# Patient Record
Sex: Male | Born: 2000 | Race: Black or African American | Hispanic: No | Marital: Single | Smoking: Never smoker
Health system: Southern US, Community
[De-identification: ages and names within clinical notes are randomized; demographics above are authoritative.]

## PROBLEM LIST (undated history)

## (undated) DIAGNOSIS — E119 Type 2 diabetes mellitus without complications: Secondary | ICD-10-CM

## (undated) HISTORY — DX: Type 2 diabetes mellitus without complications: E11.9

## (undated) HISTORY — PX: EAR TUBE REMOVAL: SHX1486

---
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
## 2021-01-07 ENCOUNTER — Encounter (INDEPENDENT_AMBULATORY_CARE_PROVIDER_SITE_OTHER): Payer: Self-pay

## 2021-03-13 ENCOUNTER — Telehealth (INDEPENDENT_AMBULATORY_CARE_PROVIDER_SITE_OTHER): Payer: Self-pay | Admitting: Family

## 2021-03-13 NOTE — Telephone Encounter (Signed)
  Who's calling (name and relationship to patient) : Kathie Rhodes (mother) and [name] Mcintosh (patient); Vishruth gave verbal permission to speak to his mother regarding this phone note.   Best contact number: [phone]  Provider they see: Gretchen Short, NP  Reason for call: Patient no longer has active Medicaid and will not be able to be covered on mom's insurance until January of 2023. They called inquiring about patient assistance programs for Dexcom, Lantus, Novolog, and test strips.  Please advise.      PRESCRIPTION REFILL ONLY  Name of prescription:  Pharmacy:

## 2021-03-13 NOTE — Telephone Encounter (Signed)
Please contact patient to ensure he has enough long acting and rapid acting insulin. If not please provide 2-3 samples of each.  There are patient assistance programs for insulin and Dexcom. You will have to complete an application and likely submit proof of income documentation (tax returns).  He can apply for [employer] patient assistance (will ship medications to his house) or NovoCares patient assistance (will ship medications to the office - patient will have to pick up medication from the office).  [employer] covers the following medications -Basaglar -Humalog  NovoCares covers the following medications -Tresiba -Novolog -Pen needles  Dexcom also has a patient assistance program and will ship medications to his house.  You can apply online to the [employer] and Dexcom patient assistance programs at the following links. You must print out forms then complete them then bring them to our office for the [facility] program.  Links for each assistance program are listed below:  [employer] Cares: [email or web address] NovoCares: https://www.novocare.com/content/dam/diabetes-patient/novocare/redesign/General/PAP-Application-EN.pdf  Dexcom: https://assistance.dexcom.com/PAP_SelfService/  If patient requires a manual glucometer, the most affordable is the Relion brand that is available over the counter at [employer]. He can buy the meter, test strips, lancing device, and lancets over the counter from [employer].   If he does not get needles from NovoCares then he can also purchase these over the counter at any pharmacy (he likely will need 32 gauge, 4 mm). The most affordable pharmacy to purchase pen needles at is [facility].   Thank you for involving clinical pharmacist/diabetes educator to assist in providing this patient's care.   Zachery Conch, PharmD, BCACP, CDCES, CPP

## 2021-03-13 NOTE — Telephone Encounter (Signed)
Made in error. Tariya Morrissette 

## 2021-03-14 ENCOUNTER — Other Ambulatory Visit (INDEPENDENT_AMBULATORY_CARE_PROVIDER_SITE_OTHER): Payer: Self-pay | Admitting: Family

## 2021-03-14 DIAGNOSIS — E109 Type 1 diabetes mellitus without complications: Secondary | ICD-10-CM

## 2021-03-17 ENCOUNTER — Encounter (INDEPENDENT_AMBULATORY_CARE_PROVIDER_SITE_OTHER): Payer: Self-pay | Admitting: Family

## 2021-03-17 ENCOUNTER — Ambulatory Visit (INDEPENDENT_AMBULATORY_CARE_PROVIDER_SITE_OTHER): Payer: Medicaid Other | Admitting: Family

## 2021-03-17 ENCOUNTER — Encounter (INDEPENDENT_AMBULATORY_CARE_PROVIDER_SITE_OTHER): Payer: Self-pay

## 2021-03-17 ENCOUNTER — Other Ambulatory Visit: Payer: Self-pay

## 2021-03-17 VITALS — BP 108/72 | HR 66 | Ht 70.47 in | Wt 209.8 lb

## 2021-03-17 DIAGNOSIS — E1065 Type 1 diabetes mellitus with hyperglycemia: Secondary | ICD-10-CM

## 2021-03-17 DIAGNOSIS — Z794 Long term (current) use of insulin: Secondary | ICD-10-CM

## 2021-03-17 LAB — POCT GLYCOSYLATED HEMOGLOBIN (HGB A1C): HbA1c, POC (controlled diabetic range): 7 % (ref 0.0–7.0)

## 2021-03-17 LAB — POCT GLUCOSE (DEVICE FOR HOME USE): Glucose Fasting, POC: 191 mg/dL — AB (ref 70–99)

## 2021-03-17 NOTE — Patient Instructions (Signed)
  [employer] Cares: [email or web address] NovoCares: https://www.novocare.com/content/dam/diabetes-patient/novocare/redesign/General/PAP-Application-EN.pdf  Dexcom: https://assistance.dexcom.com/PAP_SelfService/   It was a pleasure seeing you in clinic today. Please do not hesitate to contact me if you have questions or concerns.   Please sign up for MyChart. This is a communication tool that allows you to send an email directly to me. This can be used for questions, prescriptions and blood sugar reports. We will also release labs to you with instructions on MyChart. Please do not use MyChart if you need immediate or emergency assistance. Ask our wonderful front office staff if you need assistance.

## 2021-03-17 NOTE — Progress Notes (Signed)
Pediatric Endocrinology Diabetes Consultation Follow-up Visit  [name] Mcintosh Oct 19, 2000 [id number]  Chief Complaint: Follow-up Type 1 Diabetes    Pediatrics, [facility]   HPI: [name] Mcintosh  is a 20 y.o. male presenting for follow-up of Type 1 Diabetes   he is accompanied to this visit by his Mother  1. He was diagnosed with new onset type 1 diabetes on 06/2019. He was in the PICU initially then transitioned off insulin drip to MDI and received extensive diabetes education while at [facility]. His diabetes antibodies were negative but he had a low c-peptide.   2. Since his last visit to clinic on 06/2020 he has been well. No Er visits or hospitalizations.   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   After his last visit his blood sugars started running higher so he has increased his Lantus and Novolog. He estimates he is taking 6-8 units of Novolog per meal. Gives injections before eating. Does well carb counting and using his novolog plan. Wears Dexcom CGM most of the time.   [name] Mcintosh's medicaid was recently discontinued. Mother will not be able to add him to her plan until next year. They are concerned about being able to afford his insulin and CGM supplies.   Concerns:  - Insulin needs have increased  - insurance lost.    Insulin regimen: Lantus 10  units  Novolog 150/50/15  Hypoglycemia: can feel most low blood sugars.  No glucagon needed recently.  Blood glucose download:  CGM download:    Med-alert ID: is not currently wearing. Injection/Pump sites: abdomen  Annual labs due: 09/2021  Ophthalmology due: 2023.  Reminded to get annual dilated eye exam    3. ROS: Greater than 10 systems reviewed with pertinent positives listed in HPI, otherwise neg. Constitutional: Good energy. 19 lbs weight gain  Eyes: No changes in vision Ears/Nose/Mouth/Throat: No difficulty swallowing. Cardiovascular: No  palpitations Respiratory: No increased work of breathing Gastrointestinal: No constipation or diarrhea. No abdominal pain Genitourinary: No nocturia, no polyuria Musculoskeletal: No joint pain Neurologic: Normal sensation, no tremor Endocrine: No polydipsia.  No hyperpigmentation Psychiatric: Normal affect  Past Medical History:   Past Medical History:  Diagnosis Date   Diabetes mellitus without complication ([place])     Medications:  Outpatient Encounter Medications as of 03/17/2021  Medication Sig Note   Accu-Chek FastClix Lancets MISC Check sugar 10 x daily    Alcohol Swabs (ALCOHOL PADS) 70 % PADS Use to wipe skin prior to insulin injection 7 times daily    BD PEN NEEDLE NANO 2ND GEN 32G X 4 MM MISC BD PEN NEEDLES- BRAND SPECIFIC. INJECT INSULIN VIA INSULIN PEN 6 X DAILY    glucose blood (ACCU-CHEK GUIDE) test strip USE TO CHECK BLOOD GLUCOSE 6 TIMES DAILY    insulin aspart (NOVOLOG FLEXPEN) 100 UNIT/ML FlexPen 3 month supply. Inject up to 70 units per day as prescribed    insulin glargine (LANTUS SOLOSTAR) 100 UNIT/ML Solostar Pen 3 month supply. Inject up to 50 units per day    acetone, urine, test strip Check ketones per protocol (Patient not taking: No sig reported) 03/28/2020: PRN   Blood Glucose Monitoring Suppl (ACCU-CHEK GUIDE) w/Device KIT 1 kit by Does not apply route 6 (six) times daily. (Patient not taking: No sig reported)    Continuous Blood Gluc Sensor (DEXCOM G6 SENSOR) MISC 1 Units by Does not apply route as needed. 1 box  contain 3 sensors for one month. (Patient not taking: No sig reported)    Continuous Blood Gluc Transmit (DEXCOM G6 TRANSMITTER) MISC Change every 90 days. (Patient not taking: Reported on 03/17/2021)    Glucagon (BAQSIMI TWO PACK) 3 MG/DOSE POWD Place 1 application into the nose as needed. Use as directed if unconscious, unable to take food po, or having a seizure due to hypoglycemia (Patient not taking: No sig reported) 03/28/2020: PRN emergencies    Lancets Misc. (ACCU-CHEK FASTCLIX LANCET) KIT Use to check blood sugar up to 10 times daily (Patient not taking: No sig reported)    No facility-administered encounter medications on file as of 03/17/2021.    Allergies: No Known Allergies  Surgical History: Past Surgical History:  Procedure Laterality Date   EAR TUBE REMOVAL      Family History:  No family history on file.    Social History: Lives with: Mother Freshman year at [employer].   Physical Exam:  Vitals:   03/17/21 0943  BP: 108/72  Pulse: 66  Weight: 209 lb 12.8 oz (95.2 kg)  Height: 5' 10.47" (1.79 m)    BP 108/72   Pulse 66   Ht 5' 10.47" (1.79 m)   Wt 209 lb 12.8 oz (95.2 kg)   BMI 29.70 kg/m  Body mass index: body mass index is 29.7 kg/m. Blood pressure percentiles are not available for patients who are 18 years or older.  Ht Readings from Last 3 Encounters:  03/17/21 5' 10.47" (1.79 m) (64 %, Z= 0.35)*  07/02/20 5' 10.47" (1.79 m) (66 %, Z= 0.40)*  06/28/20 5' 10.47" (1.79 m) (66 %, Z= 0.40)*   * Growth percentiles are based on CDC (Boys, 2-20 Years) data.   Wt Readings from Last 3 Encounters:  03/17/21 209 lb 12.8 oz (95.2 kg) (96 %, Z= 1.73)*  07/02/20 190 lb 3.2 oz (86.3 kg) (91 %, Z= 1.36)*  06/28/20 190 lb 9.6 oz (86.5 kg) (92 %, Z= 1.37)*   * Growth percentiles are based on CDC (Boys, 2-20 Years) data.   General: Well developed, well nourished male in no acute distress.   Head: Normocephalic, atraumatic.   Eyes:  Pupils equal and round. EOMI.  Sclera white.  No eye drainage.   Ears/Nose/Mouth/Throat: Nares patent, no nasal drainage.  Normal dentition, mucous membranes moist.  Neck: supple, no cervical lymphadenopathy, no thyromegaly Cardiovascular: regular rate, normal S1/S2, no murmurs Respiratory: No increased work of breathing.  Lungs clear to auscultation bilaterally.  No wheezes. Abdomen: soft, nontender, nondistended. Normal bowel sounds.  No appreciable masses  Extremities: warm,  well perfused, cap refill < 2 sec.   Musculoskeletal: Normal muscle mass.  Normal strength Skin: warm, dry.  No rash or lesions. Neurologic: alert and oriented, normal speech, no tremor   Labs: Last hemoglobin A1c: 7.4% on 09/2020 Lab Results  Component Value Date   HGBA1C 7.0 03/17/2021     Lab Results  Component Value Date   HGBA1C 7.0 03/17/2021   HGBA1C 7.4 (H) 09/10/2020   HGBA1C 7.1 (A) 06/28/2020    Lab Results  Component Value Date   CREATININE 0.90 09/10/2020    Assessment/Plan: Alcee is a 20 y.o. male with recently diagnosed type 1 diabetes on MDI. He is out of honeymoon period and has increased insulin. Hemoglobin A1c is 7% today which meets ADA goal.     1. Type 1 diabetes  2. Hyperglycemia  3. Hypoglycemia  4. Insulin dose change  - Reviewed meter  and  CGM download. Discussed trends and patterns.  - Rotate injection sites to prevent scar tissue.  - bolus 15 minutes prior to eating to limit blood sugar spikes.  - Reviewed carb counting and importance of accurate carb counting.  - Discussed signs and symptoms of hypoglycemia. Always have glucose available.  - POCT glucose and hemoglobin A1c  - Reviewed growth chart.  - 10 unis of Lantus  - Novolog 150/50/15  plan  - Gave resources for [employer] and Dexcom patient assistance programs. Also advised to contact [place] about student insurance.  - Microalbumin ordered  Follow-up:   3 months.   Medical decision-making:  >45 spent today reviewing the medical chart, counseling the patient/family, and documenting today's visit.    When a patient is on insulin, intensive monitoring of blood glucose levels is necessary to avoid hyperglycemia and hypoglycemia. Severe hyperglycemia/hypoglycemia can lead to hospital admissions and be life threatening.     Hermenia Bers,  FNP-C  Pediatric Specialist  [address] [place]  [place], [address]  Tele: [phone]

## 2021-03-26 ENCOUNTER — Other Ambulatory Visit (INDEPENDENT_AMBULATORY_CARE_PROVIDER_SITE_OTHER): Payer: Self-pay | Admitting: Family

## 2021-03-31 ENCOUNTER — Other Ambulatory Visit (INDEPENDENT_AMBULATORY_CARE_PROVIDER_SITE_OTHER): Payer: Self-pay | Admitting: Family

## 2021-03-31 ENCOUNTER — Encounter (INDEPENDENT_AMBULATORY_CARE_PROVIDER_SITE_OTHER): Payer: Self-pay

## 2021-04-02 ENCOUNTER — Other Ambulatory Visit (INDEPENDENT_AMBULATORY_CARE_PROVIDER_SITE_OTHER): Payer: Self-pay | Admitting: Family

## 2021-04-02 ENCOUNTER — Other Ambulatory Visit (INDEPENDENT_AMBULATORY_CARE_PROVIDER_SITE_OTHER): Payer: Self-pay

## 2021-04-02 DIAGNOSIS — E109 Type 1 diabetes mellitus without complications: Secondary | ICD-10-CM

## 2021-04-02 MED ORDER — BD PEN NEEDLE NANO 2ND GEN 32G X 4 MM MISC: 5 refills | Status: DC

## 2021-04-02 MED ORDER — DEXCOM G6 SENSOR MISC: 11 refills | Status: DC

## 2021-04-08 ENCOUNTER — Encounter (INDEPENDENT_AMBULATORY_CARE_PROVIDER_SITE_OTHER): Payer: Self-pay

## 2021-04-08 ENCOUNTER — Other Ambulatory Visit (INDEPENDENT_AMBULATORY_CARE_PROVIDER_SITE_OTHER): Payer: Self-pay

## 2021-04-08 DIAGNOSIS — E109 Type 1 diabetes mellitus without complications: Secondary | ICD-10-CM

## 2021-04-08 MED ORDER — BD PEN NEEDLE NANO 2ND GEN 32G X 4 MM MISC
5 refills | Status: DC
Start: 2021-04-08 — End: 2021-04-23

## 2021-04-23 ENCOUNTER — Other Ambulatory Visit (INDEPENDENT_AMBULATORY_CARE_PROVIDER_SITE_OTHER): Payer: Self-pay | Admitting: Pediatric Endocrinology

## 2021-04-23 DIAGNOSIS — E109 Type 1 diabetes mellitus without complications: Secondary | ICD-10-CM

## 2021-04-29 ENCOUNTER — Encounter (INDEPENDENT_AMBULATORY_CARE_PROVIDER_SITE_OTHER): Payer: Self-pay

## 2021-04-29 ENCOUNTER — Other Ambulatory Visit (INDEPENDENT_AMBULATORY_CARE_PROVIDER_SITE_OTHER): Payer: Self-pay

## 2021-04-29 DIAGNOSIS — E109 Type 1 diabetes mellitus without complications: Secondary | ICD-10-CM

## 2021-04-29 MED ORDER — NOVOLOG FLEXPEN 100 UNIT/ML ~~LOC~~ SOPN
PEN_INJECTOR | SUBCUTANEOUS | 2 refills | Status: DC
Start: 2021-04-29 — End: 2021-08-08

## 2021-04-30 ENCOUNTER — Telehealth (INDEPENDENT_AMBULATORY_CARE_PROVIDER_SITE_OTHER): Payer: Self-pay

## 2021-04-30 ENCOUNTER — Other Ambulatory Visit (INDEPENDENT_AMBULATORY_CARE_PROVIDER_SITE_OTHER): Payer: Self-pay

## 2021-04-30 ENCOUNTER — Encounter (INDEPENDENT_AMBULATORY_CARE_PROVIDER_SITE_OTHER): Payer: Self-pay

## 2021-04-30 NOTE — Telephone Encounter (Signed)
Debe Coder Key: [id number] - PA Case ID: [id number] - Rx #: [id number] Need help? Call us at [phone] Status Sent to [employer] Dexcom G6 Sensor Form [employer] Medicaid Electronic Prior Authorization Form [phone] NCPDP) Original Claim Info 81

## 2021-04-30 NOTE — Telephone Encounter (Signed)
Sensors approved. Notified patient via my chart

## 2021-06-11 ENCOUNTER — Encounter (INDEPENDENT_AMBULATORY_CARE_PROVIDER_SITE_OTHER): Payer: Self-pay

## 2021-06-16 ENCOUNTER — Ambulatory Visit (INDEPENDENT_AMBULATORY_CARE_PROVIDER_SITE_OTHER): Payer: Self-pay | Admitting: Family

## 2021-07-07 ENCOUNTER — Ambulatory Visit (INDEPENDENT_AMBULATORY_CARE_PROVIDER_SITE_OTHER): Payer: Medicaid Other | Admitting: Family

## 2021-07-07 ENCOUNTER — Other Ambulatory Visit: Payer: Self-pay

## 2021-07-07 ENCOUNTER — Encounter (INDEPENDENT_AMBULATORY_CARE_PROVIDER_SITE_OTHER): Payer: Self-pay | Admitting: Family

## 2021-07-07 VITALS — BP 120/78 | HR 82 | Wt 214.6 lb

## 2021-07-07 DIAGNOSIS — E10649 Type 1 diabetes mellitus with hypoglycemia without coma: Secondary | ICD-10-CM | POA: Diagnosis not present

## 2021-07-07 DIAGNOSIS — Z794 Long term (current) use of insulin: Secondary | ICD-10-CM | POA: Diagnosis not present

## 2021-07-07 DIAGNOSIS — E1065 Type 1 diabetes mellitus with hyperglycemia: Secondary | ICD-10-CM | POA: Diagnosis not present

## 2021-07-07 LAB — POCT GLUCOSE (DEVICE FOR HOME USE): POC Glucose: 257 mg/dl — AB (ref 70–99)

## 2021-07-07 LAB — POCT GLYCOSYLATED HEMOGLOBIN (HGB A1C): Hemoglobin A1C: 7.2 % — AB (ref 4.0–5.6)

## 2021-07-07 MED ORDER — BAQSIMI TWO PACK 3 MG/DOSE NA POWD: 1.0000 "application " | NASAL | 1 refills | Status: DC | PRN

## 2021-07-07 NOTE — Patient Instructions (Addendum)
-   increase Lantus to 13 units  - At night, give 50% of you recommended correction insulin.  - You get 1 unit for every 50 points above 150  - Give 1 unit for every 12 grams of carbs.   - Look at Omnipod vs Tandem TSlim Control IQ   You are being referred for insulin pump training.     The first class you must attend is the prepump appointment. This is a one-on-one class with the patient, patient's caregivers, and diabetes educator. This class may take up to 1 hour and is required to be successful with insulin pump management. This class can be virtual or in-person.   We will complete required documentation for starting insulin pump. If this appointment is virtual, you must have access to a computer to complete forms online.   Topics discussed will include the following list: Insulin Pump Basics (bolus, basal, insulin on board) Pump Site Failure Pump Failure Traveling Tips Instructions for Pump Appointment  Please come prepared to learn and take notes as we take the next step in your diabetes journey!  After completion of prepump class, you will be scheduled for a pump start class that must be attended in-person. This class is a one-on-one class with the patient, patient's caregivers, and diabetes educator. This class may take up to 2 hours.   Topics discussed will include the following list:  Synching pump and electronic devices to office General information about your insulin pump  How to use features of your insulin pump How to appropriately do a site change How to bolus via your insulin pump Pump alarms/alerts Temporary basal rates Account creation for pump devices  After completion of pump class, you will be scheduled for a pump follow up appointment. This pump follow up appointment may take up to 1 hour. This class may be in-person or virtual (if pump has been setup to share with the clinic).  Topics discussed will include the following list: Insulin pump settings changes  (if necessary) General review of any issues since pump start Extended bolus Exercise/physical activity management  If you have any questions/concerns regarding this process please contact [phone].

## 2021-07-07 NOTE — Progress Notes (Signed)
Pediatric Endocrinology Diabetes Consultation Follow-up Visit  Mohamedamin ESTLE HUGULEY [birth year] [id number]  Chief Complaint: Follow-up Type 1 Diabetes    Pediatrics, [facility]   HPI: [name] Mcintosh  is a 20 y.o. male presenting for follow-up of Type 1 Diabetes   he is accompanied to this visit by his Mother  1. He was diagnosed with new onset type 1 diabetes on 06/2019. He was in the PICU initially then transitioned off insulin drip to MDI and received extensive diabetes education while at [facility]. His diabetes antibodies were negative but he had a low c-peptide.   2. Since his last visit to clinic on 03/2021 he has been well. No Er visits or hospitalizations.   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 He is living on campus which is going well. He is exercising almost every day, working out at gym.   Since his last visit, he got [place] Medicaid reinstated. Using Dexcom CGM, which is working well for him. He is taking most of his shots before eating. He estimates he gives between 10-12 units at meals. Hypoglycemia has occurred more frequently recently. He reports that he has been estimating his insulin doses instead of calculating them using his plan. He states that he tends to go low at night because he will "guess" the correction dose and gives to much. When he does not give corrections overnight, his blood sugars tend to stay higher.    Insulin regimen: Lantus 12  units  Novolog 150/50/15  Hypoglycemia: can feel most low blood sugars.  No glucagon needed recently.  Blood glucose download:  CGM download:    - Pattern of blood sugars dropping overnight in response to correction insulin.  Med-alert ID: is not currently wearing. Injection/Pump sites: abdomen  Annual labs due: 09/2021  Ophthalmology due: 2023.  Reminded to get annual dilated eye exam    3. ROS: Greater than 10 systems reviewed with pertinent positives listed in HPI, otherwise neg. Constitutional: Good energy.  Weight stable  Eyes: No changes in vision Ears/Nose/Mouth/Throat: No difficulty swallowing. Cardiovascular: No palpitations Respiratory: No increased work of breathing Gastrointestinal: No constipation or diarrhea. No abdominal pain Genitourinary: No nocturia, no polyuria Musculoskeletal: No joint pain Neurologic: Normal sensation, no tremor Endocrine: No polydipsia.  No hyperpigmentation Psychiatric: Normal affect  Past Medical History:   Past Medical History:  Diagnosis Date   Diabetes mellitus without complication ([place])     Medications:  Outpatient Encounter Medications as of 07/07/2021  Medication Sig Note   Continuous Blood Gluc Sensor (DEXCOM G6 SENSOR) MISC Change every 10 days    Continuous Blood Gluc Transmit (DEXCOM G6 TRANSMITTER) MISC USE AS DIRECTED    insulin aspart (NOVOLOG FLEXPEN) 100 UNIT/ML FlexPen 3 month supply. Inject up to 70 units per day as prescribed    insulin glargine (LANTUS SOLOSTAR) 100 UNIT/ML Solostar Pen 3 month supply. Inject up to 50 units per day    Accu-Chek FastClix Lancets MISC Check sugar 10 x daily (Patient not taking: Reported on 07/07/2021)    acetone, urine, test strip Check ketones per protocol (Patient not taking: Reported on 03/28/2020) 03/28/2020: PRN   Alcohol Swabs (ALCOHOL PADS) 70 % PADS Use to wipe skin prior to insulin injection 7 times daily (Patient not taking: Reported on 07/07/2021)    Blood Glucose Monitoring Suppl (ACCU-CHEK GUIDE) w/Device KIT 1 kit by Does not apply route 6 (six) times daily. (Patient not taking: Reported on 06/28/2020)    Glucagon (BAQSIMI TWO PACK) 3 MG/DOSE  POWD Place 1 application into the nose as needed. Use as directed if unconscious, unable to take food po, or having a seizure due to hypoglycemia    glucose blood (ACCU-CHEK GUIDE) test strip USE TO CHECK BLOOD GLUCOSE 6 TIMES DAILY (Patient not taking: Reported on 07/07/2021)    Insulin Pen Needle (BD PEN NEEDLE NANO 2ND GEN) 32G X 4 MM MISC Use to  inject insulin 8 times a day (Patient not taking: Reported on 07/07/2021)    Lancets Misc. (ACCU-CHEK FASTCLIX LANCET) KIT Use to check blood sugar up to 10 times daily (Patient not taking: Reported on 06/28/2020)    [DISCONTINUED] Glucagon (BAQSIMI TWO PACK) 3 MG/DOSE POWD Place 1 application into the nose as needed. Use as directed if unconscious, unable to take food po, or having a seizure due to hypoglycemia (Patient not taking: Reported on 03/28/2020) 03/28/2020: PRN emergencies   No facility-administered encounter medications on file as of 07/07/2021.    Allergies: No Known Allergies  Surgical History: Past Surgical History:  Procedure Laterality Date   EAR TUBE REMOVAL      Family History:  No family history on file.    Social History: Lives with: Mother Freshman year at [employer]   Physical Exam:  Vitals:   07/07/21 1125  BP: 120/78  Pulse: 82  Weight: 214 lb 9.6 oz (97.3 kg)     BP 120/78 (BP Location: Right Arm, Patient Position: Sitting, Cuff Size: Normal)   Pulse 82   Wt 214 lb 9.6 oz (97.3 kg)   BMI 30.38 kg/m  Body mass index: body mass index is 30.38 kg/m. Blood pressure percentiles are not available for patients who are 18 years or older.  Ht Readings from Last 3 Encounters:  03/17/21 5' 10.47" (1.79 m) (64 %, Z= 0.35)*  07/02/20 5' 10.47" (1.79 m) (66 %, Z= 0.40)*  06/28/20 5' 10.47" (1.79 m) (66 %, Z= 0.40)*   * Growth percentiles are based on CDC (Boys, 2-20 Years) data.   Wt Readings from Last 3 Encounters:  07/07/21 214 lb 9.6 oz (97.3 kg) (96 %, Z= 1.80)*  03/17/21 209 lb 12.8 oz (95.2 kg) (96 %, Z= 1.73)*  07/02/20 190 lb 3.2 oz (86.3 kg) (91 %, Z= 1.36)*   * Growth percentiles are based on CDC (Boys, 2-20 Years) data.   General: Well developed, well nourished male in no acute distress.   Head: Normocephalic, atraumatic.   Eyes:  Pupils equal and round. EOMI.  Sclera white.  No eye drainage.   Ears/Nose/Mouth/Throat: Nares patent, no  nasal drainage.  Normal dentition, mucous membranes moist.  Neck: supple, no cervical lymphadenopathy, no thyromegaly Cardiovascular: regular rate, normal S1/S2, no murmurs Respiratory: No increased work of breathing.  Lungs clear to auscultation bilaterally.  No wheezes. Abdomen: soft, nontender, nondistended. Normal bowel sounds.  No appreciable masses  Extremities: warm, well perfused, cap refill < 2 sec.   Musculoskeletal: Normal muscle mass.  Normal strength Skin: warm, dry.  No rash or lesions. Neurologic: alert and oriented, normal speech, no tremor   Labs: Last hemoglobin A1c: 7.0% on 03/2021 Lab Results  Component Value Date   HGBA1C 7.2 (A) 07/07/2021     Lab Results  Component Value Date   HGBA1C 7.2 (A) 07/07/2021   HGBA1C 7.0 03/17/2021   HGBA1C 7.4 (H) 09/10/2020    Lab Results  Component Value Date   CREATININE 0.90 09/10/2020    Assessment/Plan: Ankur is a 20 y.o. male with recently diagnosed type 1  diabetes on MDI. He has experienced overnight hypoglycemia due to guessing at insulin dose and giving to strong of dose. He also has a pattern of blood sugars rising overnight when he does not give correction. He would benefit from closed loop insulin pump therapy. Hemoglobin A1c is 7.2% today.     1. Type 1 diabetes  2. Hyperglycemia  3. Hypoglycemia  - Reviewed meter and CGM download. Discussed trends and patterns.  - Rotate injection sites to prevent scar tissue.  - bolus 15 minutes prior to eating to limit blood sugar spikes.  - Reviewed carb counting and importance of accurate carb counting.  - Discussed signs and symptoms of hypoglycemia. Always have glucose available.  - POCT glucose and hemoglobin A1c  - Reviewed growth chart.  - Discussed hypoglycemia extensively including risk for death and seizure. Discussed treatment including baqsimi if he is unconscious.  - Discussed insulin pump therapy and pump options. Will schedule pump intro class with  Dr. Lovena Le.   4. Insulin Dose change  - Increase lantus to 13 units  - Start Novolog 150/50/12 plan. At night reduce correction dose by 50%.    Follow-up:   3 months.   Medical decision-making:  >45 spent today reviewing the medical chart, counseling the patient/family, and documenting today's visit.   When a patient is on insulin, intensive monitoring of blood glucose levels is necessary to avoid hyperglycemia and hypoglycemia. Severe hyperglycemia/hypoglycemia can lead to hospital admissions and be life threatening.     Hermenia Bers,  FNP-C  Pediatric Specialist  [address] [place]  [place], [address]  Tele: [phone]

## 2021-07-18 ENCOUNTER — Other Ambulatory Visit (INDEPENDENT_AMBULATORY_CARE_PROVIDER_SITE_OTHER): Payer: Self-pay | Admitting: Family

## 2021-07-18 DIAGNOSIS — E109 Type 1 diabetes mellitus without complications: Secondary | ICD-10-CM

## 2021-07-26 NOTE — Progress Notes (Addendum)
This is a Pediatric Specialist E-Visit (My Chart Video Visit) follow up consult provided via WebEx [name] Mcintosh consented to an E-Visit consult today.  Location of patient: [name] Mcintosh is at home  Location of provider: Zachery Conch, PharmD, BCACP, CDCES, CPP is at office.    S:     Chief Complaint  Patient presents with   Diabetes    Prepump Appointment     Endocrinology provider: Dr. Larinda Buttery (upcoming appt 10/07/21 11:00 am)  Patient has decided to initiate process to start Omnipod 5 insulin pump. PMH significant for T1DM.   I connected with [name] Mcintosh on 08/01/21 by video and verified that I am speaking with the correct person using two identifiers. He is taking Novolog 12 units with each meals (2 meals/day). He eats a few no carb snacks/day. He forgot to give his Lantus yesterday. He is taking 13 units of Lantus.  Insurance Coverage: [place] Managed Medicaid ([employer])  Preferred Pharmacy [id number] [place], [place] - [place] [address]  [address], [place] [address]  Phone:  [phone]  Fax:  [phone]  DEA #:  [id number]  DAW Reason: --    Medication Adherence -Patient reports adherence with medications.  -Current diabetes medications include: Lantus 13 units daily, Novolog (ICR 1:15, ISF 1:50, target BG 150; 50% reduction in correction dose at night) -Prior diabetes medications include: denies   Pre-pump Topics Insulin Pump Basics Sick Day Management Pump Failure Travel  Pump Start Instructions   Labs:    There were no vitals filed for this visit.  HbA1c Lab Results  Component Value Date   HGBA1C 7.2 (A) 07/07/2021   HGBA1C 7.0 03/17/2021   HGBA1C 7.4 (H) 09/10/2020    Pancreatic Islet Cell Autoantibodies Lab Results  Component Value Date   ISLETAB Negative 07/01/2019    Insulin Autoantibodies Lab Results  Component Value Date   INSULINAB <5.0 07/01/2019    Glutamic Acid Decarboxylase Autoantibodies Lab Results  Component  Value Date   GLUTAMICACAB <5.0 07/01/2019    ZnT8 Autoantibodies No results found for: ZNT8AB  IA-2 Autoantibodies No results found for: LABIA2  C-Peptide Lab Results  Component Value Date   CPEPTIDE 0.44 (L) 09/10/2020    Microalbumin No results found for: MICRALBCREAT  Lipids No results found for: CHOL, TRIG, HDL, CHOLHDL, VLDL, LDLCALC, LDLDIRECT  Assessment: Education - Thoroughly discussed all pre-pump topics (insulin pump basics, sick day management, pump failure, travel, and pump start instructions).   Pump Start Instructions - Sent prescription for Novolog vial to patient's preferred pharmacy. The patient/family understand that the family should bring all insulin pump supplies as well as insulin vial to pump start appointment. Advised patient to [facility] long acting insulin on the day before appointment.   Plan: Pre-Pump Education Discussed all pre-pump topics (insulin pump basics, sick day management, pump failure, travel, and pump start instructions) until family felt confident in their understanding of each topic.  Pump Start Appointment Sent prescription for Novolog vial to patient's preferred pharmacy.  The patient/family understand that the family should bring all insulin pump supplies as well as insulin vial to pump start appointment.  Advised patient to [facility] long acting insulin on day before appointment.  Follow Up: 08/08/21  Emailed patient instructions to [name].    This appointment required 45 minutes of patient care (this includes precharting, chart review, review of results, face-to-face care, etc.).  Thank you for involving clinical pharmacist/diabetes educator to assist in providing this patient's  care.  Zachery Conch, PharmD, BCACP, CDCES, CPP  I have reviewed the following documentation and am in agreeance with the plan. I was immediately available to the clinical pharmacist for questions and collaboration. Gretchen Short,  FNP-C   Pediatric Specialist  [address]  [place], [address]  Tele: [phone]

## 2021-07-31 ENCOUNTER — Encounter (INDEPENDENT_AMBULATORY_CARE_PROVIDER_SITE_OTHER): Payer: Self-pay | Admitting: Pharmacist

## 2021-08-01 ENCOUNTER — Telehealth (INDEPENDENT_AMBULATORY_CARE_PROVIDER_SITE_OTHER): Payer: Medicaid Other | Admitting: Pharmacist

## 2021-08-01 ENCOUNTER — Telehealth (INDEPENDENT_AMBULATORY_CARE_PROVIDER_SITE_OTHER): Payer: Self-pay

## 2021-08-01 DIAGNOSIS — E1065 Type 1 diabetes mellitus with hyperglycemia: Secondary | ICD-10-CM

## 2021-08-01 MED ORDER — OMNIPOD 5 DEXG7G6 INTRO GEN 5 KIT: 1.0000 | PACK | 2 refills | Status: DC

## 2021-08-01 MED ORDER — INSULIN ASPART 100 UNIT/ML IJ SOLN: INTRAMUSCULAR | 5 refills | Status: DC

## 2021-08-01 NOTE — Telephone Encounter (Signed)
Called patient per Dr. Ladona Ridgel to let him know she is running behind, no answer, left HIPAA approved voicemail and my chart message sent.

## 2021-08-03 NOTE — Progress Notes (Addendum)
Subjective:  Chief Complaint  Patient presents with   Omnipod 5 pump start    Endocrinology provider: Gretchen Short, NP (upcoming appt 10/07/21 11:00 am)  Patient referred to me by Gretchen Short, NP for Omnipod 5 pump training. PMH significant for T1DM. Patient is currently using Dexcom G6 CGM. Patient reports taking Lantus 13 units daily and Novolog (ICR 1:15, ISF 1:50, target BG 150). Basal injection was last admnistered 08/06/21.   Patient presents today independently. He has been administering Novolog ~12 units twice daily. He states when he gives insulin to lower his BG readings in the AM (correction dose) it does not successfully lower BG reading. He states when he gives insulin for meals it will work and lower BG reading appropriately.  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 He exercises by going to crossfit.  Insurance: [place] Managed Medicaid ([employer])  Pharmacy (when home from college) [id number] [place], [place] - [address]  [address], [place] [address]  Phone:  [phone]  Fax:  [phone]  DEA #:  [id number]  DAW Reason: --   Pharmacy (when at college) [id number] [place], [place] - [address]  [address] [place], [place] [address]  Phone:  [phone]  Fax:  [phone]  DEA #:  [id number]  DAW Reason: --    Omnipod 5 Pump Serial Number: [id number]  Omnipod Education Training Please refer to Omnipod 5 Pod Start Checklist scanned into media  G[place]o Account:  -User: [email or web address] -Pass: [id number]!  Podder Account:  -User: [email or web address] -Pass: [id number]!  Objective:  Dexcom Clarity Report   There were no vitals filed for this visit.  HbA1c Lab Results  Component Value Date   HGBA1C 7.2 (A) 07/07/2021   HGBA1C 7.0 03/17/2021   HGBA1C 7.4 (H) 09/10/2020    Pancreatic Islet Cell Autoantibodies Lab Results  Component Value Date    ISLETAB Negative 07/01/2019    Insulin Autoantibodies Lab Results  Component Value Date   INSULINAB <5.0 07/01/2019    Glutamic Acid Decarboxylase Autoantibodies Lab Results  Component Value Date   GLUTAMICACAB <5.0 07/01/2019    ZnT8 Autoantibodies No results found for: ZNT8AB  IA-2 Autoantibodies No results found for: LABIA2  C-Peptide Lab Results  Component Value Date   CPEPTIDE 0.44 (L) 09/10/2020    Microalbumin No results found for: MICRALBCREAT  Lipids No results found for: CHOL, TRIG, HDL, CHOLHDL, VLDL, LDLCALC, LDLDIRECT  Assessment: Pump Settings - Reviewed Dexcom Clarity report. He is taking TDD 37 units/day. This is ~0.38 units/kg/day. This is extremely low dose considering his age/weight/pubertal status. He is experiencing hyperglycemia throughout entire day so will increase Lantus 13 units --> 14 units; will divide by 24 to determine basal dose (0.60 units/hr). Based on rule of 450 ICR of 12 may be appropriate and based on rule of 1800 ISF of 50 is appropriate. He does complain that if he administers a correction dose upon waking (~10am) that it will not lower his BG reading. Will decrease ISF 50 --> 45 at that time and continue ISF of 50 throughout remainder of day. He states he feels his insulin works well when he boluses for meals. Will continue ICR 15 (he eats breakfast/lunch ~1pm and dinner ~7pm). Change target BG to 110 considering upgrade to hybrid closed loop system.  Pump Education - Omnipod pump applied successfully to right side of abdomen (within line of sight of Dexcom on right  leg). Parents appeared to have sufficient understanding of subjects discussed during Omnipod Training appt. Extensively reviewed how to bolus before vs after eating (if he forgets to before) and activity mode vs temp basal rate when going to cross fit.  Plan: Pump Settings  Basal (Max: 1.1 units/hr) 12AM 0.60                     Total: 14.4 units  Insulin to  carbohydrate ratio (ICR)  12AM 15  1PM 15  7PM 15               Max Bolus: 16 units  Insulin Sensitivity Factor (ISF) 12AM 50  10AM 45  1PM 50                 Target BG 12AM 110                        Omnipod Pump Education:  Continue to wear Omnipod and change pod every 3 days (pod filled 150 units) Thoroughly discussed how to assess bad infusion site change and appropriate management (notice BG is elevated, attempt to bolus via pump, recheck BG in 30 minutes, if BG has not decreased then disconnect pump and administer bolus via insulin pen, apply new infusion set, and repeat process).  Discussed back up plan if pump breaks (how to calculate insulin doses using insulin pens). Provided written copy of patient's current pump settings and handout explaining math on how to calculate settings. Discussed examples with family. Patient was able to use teach back method to demonstrate understanding of calculating dose for basal/bolus insulin pens from insulin pump settings.  Patient has Lantus and Novolog insulin pen refills to use as back up until 07/2022. Reminded family they will need a new prescription annually.  Reimbursement Uploaded Omnipod 5 Pod Start Checklist and Omnipod Dash Pump Therapy Order Form to [facility] Follow Up:  Within 1 month  Emailed Omnipod 5 Resource guide to [email or web address]  This appointment required 120 minutes of patient care (this includes precharting, chart review, review of results, face-to-face care, etc.).  Thank you for involving clinical pharmacist/diabetes educator to assist in providing this patient's care.  [employer], PharmD, BCACP, CDCES, CPP  I have reviewed the following documentation and am in agreeance with the plan. I was immediately available to the clinical pharmacist for questions and collaboration.  Zachery Conch,  FNP-C  Pediatric Specialist  [address]  [place], [place]  Tele: [phone]

## 2021-08-06 ENCOUNTER — Encounter (INDEPENDENT_AMBULATORY_CARE_PROVIDER_SITE_OTHER): Payer: Self-pay | Admitting: Pharmacist

## 2021-08-07 ENCOUNTER — Other Ambulatory Visit (INDEPENDENT_AMBULATORY_CARE_PROVIDER_SITE_OTHER): Payer: Self-pay | Admitting: Pharmacist

## 2021-08-07 DIAGNOSIS — E1065 Type 1 diabetes mellitus with hyperglycemia: Secondary | ICD-10-CM

## 2021-08-07 MED ORDER — OMNIPOD 5 DEXG7G6 INTRO GEN 5 KIT: 1.0000 | PACK | 2 refills | Status: DC

## 2021-08-08 ENCOUNTER — Ambulatory Visit (INDEPENDENT_AMBULATORY_CARE_PROVIDER_SITE_OTHER): Payer: Medicaid Other | Admitting: Pharmacist

## 2021-08-08 ENCOUNTER — Other Ambulatory Visit: Payer: Self-pay

## 2021-08-08 ENCOUNTER — Encounter (INDEPENDENT_AMBULATORY_CARE_PROVIDER_SITE_OTHER): Payer: Self-pay | Admitting: Pharmacist

## 2021-08-08 VITALS — Ht 70.39 in | Wt 215.6 lb

## 2021-08-08 DIAGNOSIS — E109 Type 1 diabetes mellitus without complications: Secondary | ICD-10-CM

## 2021-08-08 DIAGNOSIS — E1065 Type 1 diabetes mellitus with hyperglycemia: Secondary | ICD-10-CM

## 2021-08-08 DIAGNOSIS — E10649 Type 1 diabetes mellitus with hypoglycemia without coma: Secondary | ICD-10-CM

## 2021-08-08 LAB — POCT GLUCOSE (DEVICE FOR HOME USE): Glucose Fasting, POC: 227 mg/dL — AB (ref 70–99)

## 2021-08-08 MED ORDER — NOVOLOG FLEXPEN 100 UNIT/ML ~~LOC~~ SOPN: PEN_INJECTOR | SUBCUTANEOUS | 2 refills | Status: DC

## 2021-08-08 MED ORDER — INSULIN ASPART 100 UNIT/ML IJ SOLN: INTRAMUSCULAR | 5 refills | Status: DC

## 2021-08-08 MED ORDER — LANTUS SOLOSTAR 100 UNIT/ML ~~LOC~~ SOPN: PEN_INJECTOR | SUBCUTANEOUS | 2 refills | Status: DC

## 2021-08-08 NOTE — Patient Instructions (Signed)
It was a pleasure seeing you today!  If your pump breaks, your long acting insulin dose would be Lantus/Basaglar/Semglee 14 units daily. You would do the following equation for your Novolog/Humalog:  Novolog/Humalog total dose = food dose + correction dose Food dose: total carbohydrates divided by insulin carbohydrate ratio (ICR) Your ICR is 15 for breakfast, 15 for lunch, and 15 for dinner Correction dose: (current blood sugar - target blood sugar) divided by insulin sensitivity factor (ISF) Your ISF is 45 between 10am-1pm, 50 between 1pm-12am and 12am-1pm. Your target blood sugar is 120 during the day and 180 at night.  PLEASE REMEMBER TO CONTACT OFFICE IF YOU ARE AT RISK OF RUNNING OUT OF PUMP SUPPLIES, INSULIN PEN SUPPLIES, OR IF YOU WANT TO KNOW WHAT YOUR BACK UP INSULIN PEN DOSES ARE.   To summarize our visit, these are the major updates with Omnipod 5:  Automated vs limited vs manual mode Automated mode: this is when the smart pump is turned on and pump will adjust insulin based on Dexcom readings predicted 60 minutes into the future Limited mode: when pump is trying to connect to automated mode, however, there may be issues. For example, when new Dexcom sensor is applied there is a 2 hour warm up period (no CGM readings). Manual mode: this is when the smart pump is NOT turned on and pump goes back to settings put in by provider (kind of like going back to [employer]) You can switch modes by going to settings --> mode --> switch from automated to manual mode or vice versa Why would I switch from automated mode to manual mode? 1. To put in new Dexcom transmitter code (reminder you must do this every 90 days AFTER you update it in Dexcom app) To do this you will change to manual mode --> settings --> CGM transmitter --> enter new code 2. If you get put on steroid medications (e.g., prednisone, methylprednisolone) 3. If you try activity mode and still experience low blood sugars  then you can go to manual mode to turn on a temporary basal rate (decrease 100% in 30 min incrememnts) KEEP IN MIND LINE OF SIGHT WITH DEXCOM! Dexcom and pod must be on the same side of the body. They can be across from each other on the abdomen or lower back/upper buttocks (refer to pages 20 and 21 in resource guide) Make sure to press use CGM rather than type in blood sugar when blousing. When you press use CGM it takes in consideration the Dexcom reading AND arrow.  Omnipod 5 pods will have a clear tab and have Omnipod 5 written on pod compared to Dash pods (blue tab). Omnipod Dash and Omnipod 5 pods cannot be interchangeable. You must solely use Omnipod 5 pods when using Omnipod 5 PDM/app.  If your Omnipod is having issues with receiving Dexcom readings make sure to move the PDM/cellphone closer to the POD (NOT the Dexcom) (refer to page 9 of resource guide to review system communication)  Please contact me (Dr. Ladona Ridgel) at [phone] or via Mychart with any questions/concerns

## 2021-08-12 ENCOUNTER — Telehealth (INDEPENDENT_AMBULATORY_CARE_PROVIDER_SITE_OTHER): Payer: Self-pay | Admitting: Pharmacist

## 2021-08-12 NOTE — Telephone Encounter (Signed)
Called patient's pharmacy on 08/12/2021 at 10:28 AM   Spoke with pharmacy staff representative regarding Novolog vial. He states prescription is getting DUR rejection for refill too soon - likely going off of last fill of Novolog pens. Pharmacist was able to override rejection. Patient will be able to obtain prescription. Will notify patient via [facility].   Thank you for involving clinical pharmacist/diabetes educator to assist in providing this patient's care.   Drexel Iha, PharmD, BCACP, [place], CPP

## 2021-09-05 ENCOUNTER — Encounter (INDEPENDENT_AMBULATORY_CARE_PROVIDER_SITE_OTHER): Payer: Self-pay | Admitting: Pharmacist

## 2021-09-06 ENCOUNTER — Encounter (INDEPENDENT_AMBULATORY_CARE_PROVIDER_SITE_OTHER): Payer: Self-pay | Admitting: Pharmacist

## 2021-09-06 ENCOUNTER — Encounter (INDEPENDENT_AMBULATORY_CARE_PROVIDER_SITE_OTHER): Payer: Self-pay

## 2021-09-08 ENCOUNTER — Telehealth (INDEPENDENT_AMBULATORY_CARE_PROVIDER_SITE_OTHER): Payer: Self-pay

## 2021-09-08 ENCOUNTER — Other Ambulatory Visit (INDEPENDENT_AMBULATORY_CARE_PROVIDER_SITE_OTHER): Payer: Self-pay | Admitting: Pharmacist

## 2021-09-08 DIAGNOSIS — E1065 Type 1 diabetes mellitus with hyperglycemia: Secondary | ICD-10-CM

## 2021-09-08 MED ORDER — OMNIPOD 5 DEXG7G6 PODS GEN 5 MISC: 1.0000 | 4 refills | Status: DC

## 2021-09-08 NOTE — Telephone Encounter (Signed)
Note sent to [facility] to send in a prescription for Omnipod, pods

## 2021-09-11 ENCOUNTER — Encounter (INDEPENDENT_AMBULATORY_CARE_PROVIDER_SITE_OTHER): Payer: Self-pay | Admitting: Pharmacist

## 2021-09-11 ENCOUNTER — Telehealth (INDEPENDENT_AMBULATORY_CARE_PROVIDER_SITE_OTHER): Payer: Medicaid Other | Admitting: Pharmacist

## 2021-09-11 DIAGNOSIS — E109 Type 1 diabetes mellitus without complications: Secondary | ICD-10-CM | POA: Diagnosis not present

## 2021-09-11 NOTE — Progress Notes (Addendum)
This is a Pediatric Specialist E-Visit (My Chart Video Visit) follow up consult provided via WebEx [name] Mcintosh consented to an E-Visit consult today.  Location of patient: [name] BEHNKEN is at home  Location of provider: Zachery Conch, PharmD, BCACP, CDCES, CPP is at office.   S:     Chief Complaint  Patient presents with   Diabetes    Pump Follow Up    Endocrinology provider: Gretchen Short, NP (upcoming appt 10/07/21 11:00 am)  Patient referred to me by [name] Short, NP for insulin pump initiation and training. PMH significant for T1DM. Patient wears an Omnipod 5 insulin pump and Dexcom G6 CGM. Patient was started the Omnipod 5  insulin pump on 08/08/22.   I connected with [name] Mcintosh on 09/11/21 by video and verified that I am speaking with the correct person using two identifiers. He is doing well and enjoying Omnipod 5. He reports he tried wearing the pod on his back and it did not work well.   Insurance: [name] Mcintosh Managed Medicaid ([employer])   [phone] [place], [place] - [address]  [address] [place], [place] [address]  Phone:  [phone]  Fax:  [phone]  DEA #:  [id number]  DAW Reason: --   [facility]armacy [facility]home[phone] [place], [place] - [address]  [address], [place] [address]  Phone:  [phone]  Fax:  [phone]  DEA #:  [id number]  DAW Reason: --    Omnipod 5 Pump Settings   Basal (Max: 1.1 units/hr) 12AM 0.60                           Total: 14.4 units   Insulin to carbohydrate ratio (ICR)  12AM 15  1PM 15  7PM 15                 Max Bolus: 16 units   Insulin Sensitivity Factor (ISF) 12AM 50  10AM 45  1PM 50                     Target BG 12AM 110                               Pod Sites -Patient-reports pod sites are lower abdomen --Patient reports independently doing pod site changes --Patient reports rotating pod sites --Patient reports one infusion set  failures; felt confident managing it  Diet: Patient reported dietary habits:  Eats 1-2 meals/day and occasional snacks Breakfast: skips Lunch (11-12pm) Dinner (4-5 pm) Snacks: occasional snack at 10 pm a few times a week Drinks: water  Exercise: Patient-reported exercise habits: gym for 1.5-2 hours every day   Monitoring: Patient denies nocturia (nighttime urination).  Patient denies neuropathy (nerve pain). Patient denies visual changes. (Not followed by ophthalmology) Patient reports self foot exams; no open cuts/wounds on feet   O:   Labs:   Glooko Report    There were no vitals filed for this visit.  HbA1c Lab Results  Component Value Date   HGBA1C 7.2 (A) 07/07/2021   HGBA1C 7.0 03/17/2021   HGBA1C 7.4 (H) 09/10/2020    Pancreatic Islet Cell Autoantibodies Lab Results  Component Value Date   ISLETAB Negative 07/01/2019    Insulin Autoantibodies Lab Results  Component Value Date   INSULINAB <5.0 07/01/2019    Glutamic Acid Decarboxylase  Autoantibodies Lab Results  Component Value Date   GLUTAMICACAB <5.0 07/01/2019    ZnT8 Autoantibodies No results found for: ZNT8AB  IA-2 Autoantibodies No results found for: LABIA2  C-Peptide Lab Results  Component Value Date   CPEPTIDE 0.44 (L) 09/10/2020    Microalbumin No results found for: MICRALBCREAT  Lipids No results found for: CHOL, TRIG, HDL, CHOLHDL, VLDL, LDLCALC, LDLDIRECT  Assessment: TIR is close to goal > 70%. Hypoglycemia occasionally at night; not after correction bolus; increased target BG from 110 --> 120 overnight. He stated it is not nights when he is drinking alcohol; advised him if he drinks alcohol to go into activity mode for 12 hours and turn it off the next day when he wakes up; he verbalized understanding. Over the past 2 weeks he had 2 instances when his pod expired and he went multiple hours without insulin; stressed the importance of changing pod sites on time and  increased risk of DKA when he goes without insulin. There is a noticeable pattern of elevated post prandial BG readings; will decrease ICR 15 --> 12 (based on rule of 450 he may tolerate ICR of ~10). Continue ISF and basal rates. Continue wearing Dexcom G6 and Omnipod 5. Follow up with [name] Short, NP 10/07/21 and myself prn.  Plan: CHANGE Insulin pump settings:   Insulin to carbohydrate ratio (ICR)  12AM 15 --> 12  1PM 15 --> 12  7PM 15 --> 12                 Max Bolus: 16 units   Target BG 12AM 120  7AM 110  11PM  120                   Monitoring:  Continue wearing Dexcom G6 CGM [name] Mcintosh has a diagnosis of diabetes, checks blood glucose readings > 4x per day, wears an insulin pump, and requires frequent adjustments to insulin regimen. This patient will be seen every six months, minimally, to assess adherence to their CGM regimen and diabetes treatment plan. Follow Up: Follow up with [name] Short, NP 10/07/21 and myself prn.  Sent pump failure back up plan via mychart  This appointment required 35 minutes of patient care (this includes precharting, chart review, review of results, virtual care, etc.).  Thank you for involving clinical pharmacist/diabetes educator to assist in providing this patient's care.  Zachery Conch, PharmD, BCACP, CDCES, CPP  Billed [phone]   I have reviewed the following documentation and am in agreeance with the plan. I was immediately available to the clinical pharmacist for questions and collaboration. [name] Short,  FNP-C  Pediatric Specialist  [address]  [place], [address]  Tele: [phone]

## 2021-09-11 NOTE — Patient Instructions (Signed)
It was a pleasure seeing you today!  If your pump breaks, your long acting insulin dose would be Lantus 42 units daily. You would do the following equation for your Novolog:  Novolog total dose = food dose + correction dose Food dose: total carbohydrates divided by insulin carbohydrate ratio (ICR) Your ICR is 12 for breakfast, 12 for lunch, and 12 for dinner Correction dose: (current blood sugar - target blood sugar) divided by insulin sensitivity factor (ISF) Your ISF is 45 during the day and 50 at night. Your target blood sugar is 120 during the day and 180 at night.  PLEASE REMEMBER TO CONTACT OFFICE IF YOU ARE AT RISK OF RUNNING OUT OF PUMP SUPPLIES, INSULIN PEN SUPPLIES, OR IF YOU WANT TO KNOW WHAT YOUR BACK UP INSULIN PEN DOSES ARE.   Please contact me (Dr. Ladona Ridgel) at [phone] or via Mychart with any questions/concerns

## 2021-09-25 ENCOUNTER — Telehealth (INDEPENDENT_AMBULATORY_CARE_PROVIDER_SITE_OTHER): Payer: Self-pay | Admitting: Family

## 2021-09-25 NOTE — Telephone Encounter (Signed)
°  Who's calling (name and relationship to patient) : [place] = self  Best contact number: [phone]  Provider they see: Gretchen Short  Reason for call: Patient states that he is out of sensors and pharmacy says he cannot get refill until the 22nd.    PRESCRIPTION REFILL ONLY  Name of prescription:  Pharmacy:

## 2021-09-25 NOTE — Telephone Encounter (Signed)
Spoke with pharmacy. She said that he picked up on the 30th. Called [name] Mcintosh to see what happened that he used 3 sensors in 17 days. He said that he picked up on the 18th. He is going to send a picture of his receipt.

## 2021-09-26 ENCOUNTER — Telehealth (INDEPENDENT_AMBULATORY_CARE_PROVIDER_SITE_OTHER): Payer: Self-pay | Admitting: Pharmacist

## 2021-09-26 NOTE — Telephone Encounter (Signed)
Contacted patient about concerns of being out of sensors via Mychart.   He has been able to get more sensors.  Advised patient to contact me with further concerns.  Thank you for involving clinical pharmacist/diabetes educator to assist in providing this patient's care.   Zachery Conch, PharmD, BCACP, CDCES, CPP

## 2021-09-30 ENCOUNTER — Telehealth (INDEPENDENT_AMBULATORY_CARE_PROVIDER_SITE_OTHER): Payer: Self-pay

## 2021-09-30 NOTE — Telephone Encounter (Addendum)
Received fax from covermymeds prior authorization is expiring.  Complete prior authorization   Key: [id number] - PA Case ID: [id number] 09/30/2021 - sent to plan 10/03/2021 -

## 2021-10-03 NOTE — Telephone Encounter (Signed)
Received fax from covermymeds prior authorization is expiring.  Completed prior authorization

## 2021-10-07 ENCOUNTER — Ambulatory Visit (INDEPENDENT_AMBULATORY_CARE_PROVIDER_SITE_OTHER): Payer: Medicaid Other | Admitting: Family

## 2021-10-07 NOTE — Progress Notes (Unsigned)
Pediatric Endocrinology Diabetes Consultation Follow-up Visit  [name] Mcintosh 06-18-2001 [id number]  Chief Complaint: Follow-up Type 1 Diabetes    Pediatrics, [facility]   HPI: [name] Mcintosh  is a 21 y.o. male presenting for follow-up of Type 1 Diabetes   he is accompanied to this visit by his Mother  1. He was diagnosed with new onset type 1 diabetes on 06/2019. He was in the PICU initially then transitioned off insulin drip to MDI and received extensive diabetes education while at [facility]. His diabetes antibodies were negative but he had a low c-peptide.   2. Since his last visit to clinic on 06/2021 he has been well. No Er visits or hospitalizations.   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 He is living on campus which is going well. He is exercising almost every day, working out at gym.   Since his last visit, he got [place] Medicaid reinstated. Using Dexcom CGM, which is working well for him. He is taking most of his shots before eating. He estimates he gives between 10-12 units at meals. Hypoglycemia has occurred more frequently recently. He reports that he has been estimating his insulin doses instead of calculating them using his plan. He states that he tends to go low at night because he will "guess" the correction dose and gives to much. When he does not give corrections overnight, his blood sugars tend to stay higher.    Insulin regimen: Omnipod 5  Lantus 12  units  Novolog 150/50/15  (when using shots    Basal (Max: 1.1 units/hr) 12AM 0.60                           Total: 14.4 units   Insulin to carbohydrate ratio (ICR)  12AM 12  1PM 12  7PM 12                 Max Bolus: 16 units   Insulin Sensitivity Factor (ISF) 12AM 50  10AM 45  1PM 50                     Target BG 12AM 110                              Hypoglycemia: can feel most low blood sugars.  No glucagon needed recently.  Blood glucose download:  CGM download:    -  Pattern of blood sugars dropping overnight in response to correction insulin.  Med-alert ID: is not currently wearing. Injection/Pump sites: abdomen  Annual labs due: 09/2021  Ophthalmology due: 2023.  Reminded to get annual dilated eye exam    3. ROS: Greater than 10 systems reviewed with pertinent positives listed in HPI, otherwise neg. Constitutional: Good energy. Weight stable  Eyes: No changes in vision Ears/Nose/Mouth/Throat: No difficulty swallowing. Cardiovascular: No palpitations Respiratory: No increased work of breathing Gastrointestinal: No constipation or diarrhea. No abdominal pain Genitourinary: No nocturia, no polyuria Musculoskeletal: No joint pain Neurologic: Normal sensation, no tremor Endocrine: No polydipsia.  No hyperpigmentation Psychiatric: Normal affect  Past Medical History:   Past Medical History:  Diagnosis Date   Diabetes mellitus without complication ([employer])     Medications:  Outpatient Encounter Medications as of 10/07/2021  Medication Sig Note   Accu-Chek FastClix Lancets MISC Check sugar 10 x daily (Patient not taking: Reported on 07/07/2021)    ACCU-CHEK GUIDE test strip  USE TO CHECK BLOOD GLUCOSE 6 TIMES DAILY    acetone, urine, test strip Check ketones per protocol (Patient not taking: Reported on 03/28/2020) 03/28/2020: PRN   Alcohol Swabs (ALCOHOL PADS) 70 % PADS Use to wipe skin prior to insulin injection 7 times daily (Patient not taking: Reported on 07/07/2021)    Blood Glucose Monitoring Suppl (ACCU-CHEK GUIDE) w/Device KIT 1 kit by Does not apply route 6 (six) times daily. (Patient not taking: Reported on 06/28/2020)    Continuous Blood Gluc Sensor (DEXCOM G6 SENSOR) MISC Change every 10 days    Continuous Blood Gluc Transmit (DEXCOM G6 TRANSMITTER) MISC USE AS DIRECTED    Glucagon (BAQSIMI TWO PACK) 3 MG/DOSE POWD Place 1 application into the nose as needed. Use as directed if unconscious, unable to take food po, or having a seizure due to  hypoglycemia (Patient not taking: Reported on 09/11/2021)    insulin aspart (NOVOLOG FLEXPEN) 100 UNIT/ML FlexPen 3 month supply. Inject up to 70 units per day as prescribed (Patient not taking: Reported on 09/11/2021)    insulin aspart (NOVOLOG) 100 UNIT/ML injection Inject up to 200 units into insulin pump every 2 days    Insulin Disposable Pump (OMNIPOD 5 G6 INTRO, GEN 5,) KIT Inject 1 kit into the skin as directed. . Change pod every 2 days. Intro kit comes with 2 boxes of pods, PDM device, pod pals, and user manual. Please fill for Omnipod 5 Into kit [facility] [address]-42    Insulin Disposable Pump (OMNIPOD 5 G6 POD, GEN 5,) MISC Inject 1 Device into the skin as directed. Change pod every 2 days. Patient will need 3 boxes (each contain 5 pods) for a 30 day supply. Please fill for [facility] [address]-21.    insulin glargine (LANTUS SOLOSTAR) 100 UNIT/ML Solostar Pen 3 month supply. Inject up to 50 units per day (Patient not taking: Reported on 09/11/2021)    Insulin Pen Needle (BD PEN NEEDLE NANO 2ND GEN) 32G X 4 MM MISC Use to inject insulin 8 times a day (Patient not taking: Reported on 07/07/2021)    Lancets Misc. (ACCU-CHEK FASTCLIX LANCET) KIT Use to check blood sugar up to 10 times daily (Patient not taking: Reported on 06/28/2020)    No facility-administered encounter medications on file as of 10/07/2021.    Allergies: No Known Allergies  Surgical History: Past Surgical History:  Procedure Laterality Date   EAR TUBE REMOVAL      Family History:  No family history on file.    Social History: Lives with: Mother Freshman year at [employer].   Physical Exam:  There were no vitals filed for this visit.    There were no vitals taken for this visit. Body mass index: body mass index is unknown because there is no height or weight on file. Blood pressure percentiles are not available for patients who are 18 years or older.  Ht Readings from Last 3 Encounters:  08/08/21 5' 10.39" (1.788 m) (62  %, Z= 0.31)*  03/17/21 5' 10.47" (1.79 m) (64 %, Z= 0.35)*  07/02/20 5' 10.47" (1.79 m) (66 %, Z= 0.40)*   * Growth percentiles are based on CDC (Boys, 2-20 Years) data.   Wt Readings from Last 3 Encounters:  08/08/21 215 lb 9.6 oz (97.8 kg) (97 %, Z= 1.82)*  07/07/21 214 lb 9.6 oz (97.3 kg) (96 %, Z= 1.80)*  03/17/21 209 lb 12.8 oz (95.2 kg) (96 %, Z= 1.73)*   * Growth percentiles are based on CDC (Boys, 2-20  Years) data.   General: Well developed, well nourished male in no acute distress.   Head: Normocephalic, atraumatic.   Eyes:  Pupils equal and round. EOMI.  Sclera white.  No eye drainage.   Ears/Nose/Mouth/Throat: Nares patent, no nasal drainage.  Normal dentition, mucous membranes moist.  Neck: supple, no cervical lymphadenopathy, no thyromegaly Cardiovascular: regular rate, normal S1/S2, no murmurs Respiratory: No increased work of breathing.  Lungs clear to auscultation bilaterally.  No wheezes. Abdomen: soft, nontender, nondistended. Normal bowel sounds.  No appreciable masses  Extremities: warm, well perfused, cap refill < 2 sec.   Musculoskeletal: Normal muscle mass.  Normal strength Skin: warm, dry.  No rash or lesions. Neurologic: alert and oriented, normal speech, no tremor   Labs: Last hemoglobin A1c: 7.2% on 06/2021 Lab Results  Component Value Date   HGBA1C 7.2 (A) 07/07/2021     Lab Results  Component Value Date   HGBA1C 7.2 (A) 07/07/2021   HGBA1C 7.0 03/17/2021   HGBA1C 7.4 (H) 09/10/2020    Lab Results  Component Value Date   CREATININE 0.90 09/10/2020    Assessment/Plan: [name] Mcintosh is a 21 y.o. male with recently diagnosed type 1 diabetes on MDI. He has experienced overnight hypoglycemia due to guessing at insulin dose and giving to strong of dose. He also has a pattern of blood sugars rising overnight when he does not give correction. He would benefit from closed loop insulin pump therapy. Hemoglobin A1c is 7.2% today.     1. Type 1 diabetes   2. Hyperglycemia  3. Hypoglycemia  - Reviewed insulin pump and CGM download. Discussed trends and patterns.  - Rotate pump sites to prevent scar tissue.  - bolus 15 minutes prior to eating to limit blood sugar spikes.  - Reviewed carb counting and importance of accurate carb counting.  - Discussed signs and symptoms of hypoglycemia. Always have glucose available.  - POCT glucose and hemoglobin A1c  - Reviewed growth chart.    4. Insulin Dose change  - Increase lantus to 13 units  - Start Novolog 150/50/12 plan. At night reduce correction dose by 50%.    Follow-up:   3 months.   Medical decision-making:  >45 spent today reviewing the medical chart, counseling the patient/family, and documenting today's visit.   When a patient is on insulin, intensive monitoring of blood glucose levels is necessary to avoid hyperglycemia and hypoglycemia. Severe hyperglycemia/hypoglycemia can lead to hospital admissions and be life threatening.     Hermenia Bers,  FNP-C  Pediatric Specialist  [address] [place]  [place], [address]  Tele: [phone]

## 2022-01-20 ENCOUNTER — Encounter (INDEPENDENT_AMBULATORY_CARE_PROVIDER_SITE_OTHER): Payer: Self-pay

## 2022-01-20 ENCOUNTER — Other Ambulatory Visit (INDEPENDENT_AMBULATORY_CARE_PROVIDER_SITE_OTHER): Payer: Self-pay | Admitting: Family

## 2022-01-20 ENCOUNTER — Encounter (INDEPENDENT_AMBULATORY_CARE_PROVIDER_SITE_OTHER): Payer: Self-pay | Admitting: Pharmacist

## 2022-02-25 HISTORY — PX: OTHER SURGICAL HISTORY: SHX169

## 2022-03-11 ENCOUNTER — Encounter (INDEPENDENT_AMBULATORY_CARE_PROVIDER_SITE_OTHER): Payer: Self-pay

## 2022-03-12 ENCOUNTER — Other Ambulatory Visit (INDEPENDENT_AMBULATORY_CARE_PROVIDER_SITE_OTHER): Payer: Self-pay | Admitting: Pediatrics

## 2022-03-12 DIAGNOSIS — E1065 Type 1 diabetes mellitus with hyperglycemia: Secondary | ICD-10-CM

## 2022-03-19 ENCOUNTER — Encounter (INDEPENDENT_AMBULATORY_CARE_PROVIDER_SITE_OTHER): Payer: Self-pay | Admitting: Family

## 2022-03-19 ENCOUNTER — Ambulatory Visit (INDEPENDENT_AMBULATORY_CARE_PROVIDER_SITE_OTHER): Payer: Medicaid Other | Admitting: Family

## 2022-03-19 VITALS — BP 120/78 | HR 80 | Ht 70.28 in | Wt 199.6 lb

## 2022-03-19 DIAGNOSIS — Z4681 Encounter for fitting and adjustment of insulin pump: Secondary | ICD-10-CM

## 2022-03-19 DIAGNOSIS — R739 Hyperglycemia, unspecified: Secondary | ICD-10-CM

## 2022-03-19 DIAGNOSIS — E1065 Type 1 diabetes mellitus with hyperglycemia: Secondary | ICD-10-CM

## 2022-03-19 LAB — POCT GLUCOSE (DEVICE FOR HOME USE): Glucose Fasting, POC: 203 mg/dL — AB (ref 70–99)

## 2022-03-19 LAB — POCT GLYCOSYLATED HEMOGLOBIN (HGB A1C): HbA1c POC (<> result, manual entry): 8.3 % (ref 4.0–5.6)

## 2022-03-19 MED ORDER — BAQSIMI TWO PACK 3 MG/DOSE NA POWD: 1.0000 "application " | NASAL | 1 refills | Status: DC | PRN

## 2022-03-19 NOTE — Patient Instructions (Addendum)
-   Basal (Max: 1.1 units/hr) 12AM 0.60--> 0.75                           Total: 18 units    Insulin to carbohydrate ratio (ICR)  12AM 12  10am 12--> 10   9pm 12                 Max Bolus: 16 units  Hemoglobin A1c is 8.3% today.

## 2022-03-19 NOTE — Progress Notes (Signed)
Pediatric Endocrinology Diabetes Consultation Follow-up Visit  Hashim GOEBEL HELLUMS [birth year] [id number]  Chief Complaint: Follow-up Type 1 Diabetes    Pediatrics, [facility]   HPI: Kase  is a 21 y.o. male presenting for follow-up of Type 1 Diabetes   he is accompanied to this visit by his Mother  1. He was diagnosed with new onset type 1 diabetes on 06/2019. He was in the PICU initially then transitioned off insulin drip to MDI and received extensive diabetes education while at [facility]. His diabetes antibodies were negative but he had a low c-peptide.   2. Since his last visit to clinic on 06/2021 he has been well. No Er visits or hospitalizations.   He started OMnipod 5 insulin pump on 08/2021. He says that he loves the Omnipod because it is making diabetes easier. Rotates pods to legs, abdomen and arms. He usually gets 3 days before needing to change pod. Boluses before eating, rarely misses a bolus. He has been eating late at night frequently. He typically, his blood sugar is staying high after he eats for more then 3 hours. Hypoglycemia occurs rarely now. None severe.  .    Insulin regimen: Omnipod 5   Basal (Max: 1.1 units/hr) 12AM 0.60                           Total: 14.4 units   Insulin to carbohydrate ratio (ICR)  12AM 15  1PM 15  7PM 15                 Max Bolus: 16 units   Insulin Sensitivity Factor (ISF) 12AM 50  10AM 45  1PM 50                     Target BG 12AM 110                               Hypoglycemia: can feel most low blood sugars.  No glucagon needed recently.  Blood glucose download:  CGM download:   - Pattern of blood sugars dropping overnight in response to correction insulin.  Med-alert ID: is not currently wearing. Injection/Pump sites: abdomen  Annual labs due: 09/2021  Ophthalmology due: 2023.  Reminded to get annual dilated eye exam    3. ROS: Greater than 10 systems reviewed with pertinent positives listed in HPI,  otherwise neg. Constitutional: Good energy. Weight stable  Eyes: No changes in vision Ears/Nose/Mouth/Throat: No difficulty swallowing. Cardiovascular: No palpitations Respiratory: No increased work of breathing Gastrointestinal: No constipation or diarrhea. No abdominal pain Genitourinary: No nocturia, no polyuria Musculoskeletal: No joint pain Neurologic: Normal sensation, no tremor Endocrine: No polydipsia.  No hyperpigmentation Psychiatric: Normal affect  Past Medical History:   Past Medical History:  Diagnosis Date   Diabetes mellitus without complication ([place])     Medications:  Outpatient Encounter Medications as of 03/19/2022  Medication Sig Note   Accu-Chek FastClix Lancets MISC Check sugar 10 x daily    ACCU-CHEK GUIDE test strip USE TO CHECK BLOOD GLUCOSE 6 TIMES DAILY    Alcohol Swabs (ALCOHOL PADS) 70 % PADS Use to wipe skin prior to insulin injection 7 times daily    Blood Glucose Monitoring Suppl (ACCU-CHEK GUIDE) w/Device KIT 1 kit by Does not apply route 6 (six) times daily.    Continuous Blood Gluc Sensor (DEXCOM G6 SENSOR) MISC Change every 10 days  Continuous Blood Gluc Transmit (DEXCOM G6 TRANSMITTER) MISC CHANGE EVERY 90 DAYS.    insulin aspart (NOVOLOG FLEXPEN) 100 UNIT/ML FlexPen 3 month supply. Inject up to 70 units per day as prescribed    Insulin Disposable Pump (OMNIPOD 5 G6 POD, GEN 5,) MISC Inject 1 Device into the skin as directed. Change pod every 2 days. Patient will need 3 boxes (each contain 5 pods) for a 30 day supply. Please fill for [facility] [address]-21.    insulin glargine (LANTUS SOLOSTAR) 100 UNIT/ML Solostar Pen 3 month supply. Inject up to 50 units per day    Insulin Pen Needle (BD PEN NEEDLE NANO 2ND GEN) 32G X 4 MM MISC Use to inject insulin 8 times a day    Lancets Misc. (ACCU-CHEK FASTCLIX LANCET) KIT Use to check blood sugar up to 10 times daily    NOVOLOG 100 UNIT/ML injection INJECT UP TO 200 UNITS INTO INSULIN PUMP EVERY 2 DAYS     [DISCONTINUED] Glucagon (BAQSIMI TWO PACK) 3 MG/DOSE POWD Place 1 application into the nose as needed. Use as directed if unconscious, unable to take food po, or having a seizure due to hypoglycemia    acetone, urine, test strip Check ketones per protocol (Patient not taking: Reported on 03/28/2020) 03/28/2020: PRN   Glucagon (BAQSIMI TWO PACK) 3 MG/DOSE POWD Place 1 application  into the nose as needed. Use as directed if unconscious, unable to take food po, or having a seizure due to hypoglycemia    Insulin Disposable Pump (OMNIPOD 5 G6 INTRO, GEN 5,) KIT Inject 1 kit into the skin as directed. . Change pod every 2 days. Intro kit comes with 2 boxes of pods, PDM device, pod pals, and user manual. Please fill for Omnipod 5 Into kit [facility] [id number]    No facility-administered encounter medications on file as of 03/19/2022.    Allergies: No Known Allergies  Surgical History: Past Surgical History:  Procedure Laterality Date   EAR TUBE REMOVAL     wisdom teeth extraction  02/25/2022    Family History:  History reviewed. No pertinent family history.    Social History: Lives with: Mother Freshman year at [employer].   Physical Exam:  Vitals:   03/19/22 1452  BP: 120/78  Pulse: 80  Weight: 199 lb 9.6 oz (90.5 kg)  Height: 5' 10.28" (1.785 m)      BP 120/78 (BP Location: Right Arm, Patient Position: Sitting, Cuff Size: Large)   Pulse 80   Ht 5' 10.28" (1.785 m)   Wt 199 lb 9.6 oz (90.5 kg)   BMI 28.42 kg/m  Body mass index: body mass index is 28.42 kg/m. Blood pressure %iles are not available for patients who are 18 years or older.  Ht Readings from Last 3 Encounters:  03/19/22 5' 10.28" (1.785 m) (60 %, Z= 0.24)*  08/08/21 5' 10.39" (1.788 m) (62 %, Z= 0.31)*  03/17/21 5' 10.47" (1.79 m) (64 %, Z= 0.35)*   * Growth percentiles are based on CDC (Boys, 2-20 Years) data.   Wt Readings from Last 3 Encounters:  03/19/22 199 lb 9.6 oz (90.5 kg) (92 %, Z= 1.41)*  08/08/21 215  lb 9.6 oz (97.8 kg) (97 %, Z= 1.82)*  07/07/21 214 lb 9.6 oz (97.3 kg) (96 %, Z= 1.80)*   * Growth percentiles are based on CDC (Boys, 2-20 Years) data.   General: Well developed, well nourished male in no acute distress.   Head: Normocephalic, atraumatic.   Eyes:  Pupils equal and  round. EOMI.  Sclera white.  No eye drainage.   Ears/Nose/Mouth/Throat: Nares patent, no nasal drainage.  Normal dentition, mucous membranes moist.  Neck: supple, no cervical lymphadenopathy, no thyromegaly Cardiovascular: regular rate, normal S1/S2, no murmurs Respiratory: No increased work of breathing.  Lungs clear to auscultation bilaterally.  No wheezes. Abdomen: soft, nontender, nondistended. Normal bowel sounds.  No appreciable masses  Extremities: warm, well perfused, cap refill < 2 sec.   Musculoskeletal: Normal muscle mass.  Normal strength Skin: warm, dry.  No rash or lesions. Neurologic: alert and oriented, normal speech, no tremor    Labs: Last hemoglobin A1c: 7.2% on 06/2021 Lab Results  Component Value Date   HGBA1C 8.3 03/19/2022     Lab Results  Component Value Date   HGBA1C 8.3 03/19/2022   HGBA1C 7.2 (A) 07/07/2021   HGBA1C 7.0 03/17/2021    Lab Results  Component Value Date   CREATININE 0.90 09/10/2020    Assessment/Plan: Emre is a 21 y.o. male with recently diagnosed type 1 diabetes on MDI. PUmp download shows a pattern of post prandial hyperglycemia despite good carb counting and consistent bolusing. He needs a stronger carb ratio. The insulin pump is also giving him additional basal insulin (29 units per day, programmed for 15 units per day). Hemoglobin A1c has increased to 8.3% which is higher then ADA goal of <7%.   He has experienced overnight hypoglycemia due to guessing at insulin dose and giving to strong of dose. He also has a pattern of blood sugars rising overnight when he does not give correction. He would benefit from closed loop insulin pump therapy.  Hemoglobin A1c is 7.2% today.     1. Type 1 diabetes  2. Hyperglycemia  3. Hypoglycemia  - Reviewed insulin pump and CGM download. Discussed trends and patterns.  - Rotate pump sites to prevent scar tissue.  - bolus 15 minutes prior to eating to limit blood sugar spikes.  - Reviewed carb counting and importance of accurate carb counting.  - Discussed signs and symptoms of hypoglycemia. Always have glucose available.  - POCT glucose and hemoglobin A1c  - Reviewed growth chart.  - Discussed new diabetes tech  - Discussed safety with diabetes in college. Encouraged to limit late night snacking which is causing prolonged hyperglycemia. Discussed alcohol use.  - Baqsimi sent to pharmacy  - Lipid panel, TFT and microalbumin ordered.   4. Insulin Dose change  - Basal (Max: 1.1 units/hr) 12AM 0.60--> 0.75                           Total: 18 units    Insulin to carbohydrate ratio (ICR)  12AM 12  10am 12--> 10   9pm 12                 Max Bolus: 16 units  Increase lantus to 18 units  - Start Novolog 120/50/12 plan. At night reduce correction dose by 50%.    Follow-up:   3 months.   Medical decision-making:  >45 spent today reviewing the medical chart, counseling the patient/family, and documenting today's visit.   When a patient is on insulin, intensive monitoring of blood glucose levels is necessary to avoid hyperglycemia and hypoglycemia. Severe hyperglycemia/hypoglycemia can lead to hospital admissions and be life threatening.     Hermenia Bers,  FNP-C  Pediatric Specialist  [address] [place]  [place], [address]  Tele: [phone]

## 2022-03-24 ENCOUNTER — Telehealth (INDEPENDENT_AMBULATORY_CARE_PROVIDER_SITE_OTHER): Payer: Self-pay | Admitting: Family

## 2022-03-24 ENCOUNTER — Encounter (INDEPENDENT_AMBULATORY_CARE_PROVIDER_SITE_OTHER): Payer: Self-pay

## 2022-03-24 ENCOUNTER — Encounter (INDEPENDENT_AMBULATORY_CARE_PROVIDER_SITE_OTHER): Payer: Self-pay | Admitting: Pharmacist

## 2022-03-24 DIAGNOSIS — E1065 Type 1 diabetes mellitus with hyperglycemia: Secondary | ICD-10-CM

## 2022-03-24 MED ORDER — DEXCOM G6 TRANSMITTER MISC: 2 refills | Status: DC

## 2022-03-24 MED ORDER — DEXCOM G6 SENSOR MISC: 11 refills | Status: DC

## 2022-03-24 MED ORDER — OMNIPOD 5 DEXG7G6 PODS GEN 5 MISC: 4 refills | Status: DC

## 2022-03-24 NOTE — Telephone Encounter (Signed)
  Name of who is calling: Isauro   Caller's Relationship to Patient: Self  Best contact number: [phone]  Provider they see: Dr. Dalbert Garnet  Reason for call: Kupono called and stated that he need a refill on Dexcom Sensors. Aidin is requesting a callback.      PRESCRIPTION REFILL ONLY  Name of prescription: Dexcom Sensor  Pharmacy: [facility] [address]

## 2022-03-24 NOTE — Telephone Encounter (Signed)
Spoke with patient. He dosent need a transmitter. He does need pods. I have sent in a refill for the pods and sensors. Sensors are good with there PA until 2-24. I will send in another rx for the transmitter for a refill.

## 2022-03-24 NOTE — Telephone Encounter (Signed)
  Name of who is calling:[name] Mcintosh   Caller's Relationship to Patient:Self   Best contact [phone]  Provider they ATF:TDDUKGU Dalbert Garnet   Reason for call:pharmacy stated that they will need a PA for the Dexcom and the pods. Caller asked if he can receive a call back once done so he can get them as soon as possible.      PRESCRIPTION REFILL ONLY  Name of prescription:Dexcom and PODS   [facility] [place], [place] [address]

## 2022-06-29 ENCOUNTER — Encounter (INDEPENDENT_AMBULATORY_CARE_PROVIDER_SITE_OTHER): Payer: Self-pay

## 2022-06-29 ENCOUNTER — Ambulatory Visit (INDEPENDENT_AMBULATORY_CARE_PROVIDER_SITE_OTHER): Payer: Medicaid Other | Admitting: Family

## 2022-06-29 NOTE — Progress Notes (Deleted)
Pediatric Endocrinology Diabetes Consultation Follow-up Visit  [name] Mcintosh 2001/07/15 [id number]  Chief Complaint: Follow-up Type 1 Diabetes    Pediatrics, [facility]   HPI: [name] Mcintosh  is a 21 y.o. male presenting for follow-up of Type 1 Diabetes   he is accompanied to this visit by his Mother  1. He was diagnosed with new onset type 1 diabetes on 06/2019. He was in the PICU initially then transitioned off insulin drip to MDI and received extensive diabetes education while at [facility]. His diabetes antibodies were negative but he had a low c-peptide.   2. Since his last visit to clinic on 03/2022 he has been well. No Er visits or hospitalizations.   He started OMnipod 5 insulin pump on 08/2021. He says that he loves the Omnipod because it is making diabetes easier. Rotates pods to legs, abdomen and arms. He usually gets 3 days before needing to change pod. Boluses before eating, rarely misses a bolus. He has been eating late at night frequently. He typically, his blood sugar is staying high after he eats for more then 3 hours. Hypoglycemia occurs rarely now. None severe.  .    Insulin regimen: Omnipod 5  - Basal (Max: 1.1 units/hr) 12AM 0.75                           Total: 18 units    Insulin to carbohydrate ratio (ICR)  12AM 12  10am 10   9pm 12                 Max Bolus: 16 units   Insulin Sensitivity Factor (ISF) 12AM 50  10AM 45  1PM 50                     Target BG 12AM 110                               Hypoglycemia: can feel most low blood sugars.  No glucagon needed recently.  Blood glucose download:  CGM download:   - Pattern of blood sugars dropping overnight in response to correction insulin.  Med-alert ID: is not currently wearing. Injection/Pump sites: abdomen  Annual labs due: 09/2021  Ophthalmology due: 2023.  Reminded to get annual dilated eye exam    3. ROS: Greater than 10 systems reviewed with pertinent positives listed in HPI,  otherwise neg. Constitutional: Good energy. Weight stable  Eyes: No changes in vision Ears/Nose/Mouth/Throat: No difficulty swallowing. Cardiovascular: No palpitations Respiratory: No increased work of breathing Gastrointestinal: No constipation or diarrhea. No abdominal pain Genitourinary: No nocturia, no polyuria Musculoskeletal: No joint pain Neurologic: Normal sensation, no tremor Endocrine: No polydipsia.  No hyperpigmentation Psychiatric: Normal affect  Past Medical History:   Past Medical History:  Diagnosis Date   Diabetes mellitus without complication ([place])     Medications:  Outpatient Encounter Medications as of 06/29/2022  Medication Sig Note   Accu-Chek FastClix Lancets MISC Check sugar 10 x daily    ACCU-CHEK GUIDE test strip USE TO CHECK BLOOD GLUCOSE 6 TIMES DAILY    acetone, urine, test strip Check ketones per protocol (Patient not taking: Reported on 03/28/2020) 03/28/2020: PRN   Alcohol Swabs (ALCOHOL PADS) 70 % PADS Use to wipe skin prior to insulin injection 7 times daily    Blood Glucose Monitoring Suppl (ACCU-CHEK GUIDE) w/Device KIT 1 kit by Does not apply route 6 (  six) times daily.    Continuous Blood Gluc Sensor (DEXCOM G6 SENSOR) MISC Change every 10 days    Continuous Blood Gluc Transmit (DEXCOM G6 TRANSMITTER) MISC CHANGE EVERY 90 DAYS.    Glucagon (BAQSIMI TWO PACK) 3 MG/DOSE POWD Place 1 application  into the nose as needed. Use as directed if unconscious, unable to take food po, or having a seizure due to hypoglycemia    insulin aspart (NOVOLOG FLEXPEN) 100 UNIT/ML FlexPen 3 month supply. Inject up to 70 units per day as prescribed    Insulin Disposable Pump (OMNIPOD 5 G6 POD, GEN 5,) MISC Change pod every 2 days. Patient will need 3 boxes (each contain 5 pods) for a 30 day supply. Please fill for [facility] [id number].    insulin glargine (LANTUS SOLOSTAR) 100 UNIT/ML Solostar Pen 3 month supply. Inject up to 50 units per day    Insulin Pen Needle (BD PEN  NEEDLE NANO 2ND GEN) 32G X 4 MM MISC Use to inject insulin 8 times a day    Lancets Misc. (ACCU-CHEK FASTCLIX LANCET) KIT Use to check blood sugar up to 10 times daily    NOVOLOG 100 UNIT/ML injection INJECT UP TO 200 UNITS INTO INSULIN PUMP EVERY 2 DAYS    No facility-administered encounter medications on file as of 06/29/2022.    Allergies: No Known Allergies  Surgical History: Past Surgical History:  Procedure Laterality Date   EAR TUBE REMOVAL     wisdom teeth extraction  02/25/2022    Family History:  No family history on file.    Social History: Lives with: Mother Freshman year at [employer]   Physical Exam:  There were no vitals filed for this visit.     There were no vitals taken for this visit. Body mass index: body mass index is unknown because there is no height or weight on file. Blood pressure %iles are not available for patients who are 18 years or older.  Ht Readings from Last 3 Encounters:  03/19/22 5' 10.28" (1.785 m) (60 %, Z= 0.24)*  08/08/21 5' 10.39" (1.788 m) (62 %, Z= 0.31)*  03/17/21 5' 10.47" (1.79 m) (64 %, Z= 0.35)*   * Growth percentiles are based on CDC (Boys, 2-20 Years) data.   Wt Readings from Last 3 Encounters:  03/19/22 199 lb 9.6 oz (90.5 kg) (92 %, Z= 1.41)*  08/08/21 215 lb 9.6 oz (97.8 kg) (97 %, Z= 1.82)*  07/07/21 214 lb 9.6 oz (97.3 kg) (96 %, Z= 1.80)*   * Growth percentiles are based on CDC (Boys, 2-20 Years) data.   General: Well developed, well nourished male in no acute distress.  Head: Normocephalic, atraumatic.   Eyes:  Pupils equal and round. EOMI.  Sclera white.  No eye drainage.   Ears/Nose/Mouth/Throat: Nares patent, no nasal drainage.  Normal dentition, mucous membranes moist.  Neck: supple, no cervical lymphadenopathy, no thyromegaly Cardiovascular: regular rate, normal S1/S2, no murmurs Respiratory: No increased work of breathing.  Lungs clear to auscultation bilaterally.  No wheezes. Abdomen: soft,  nontender, nondistended. Normal bowel sounds.  No appreciable masses  Extremities: warm, well perfused, cap refill < 2 sec.   Musculoskeletal: Normal muscle mass.  Normal strength Skin: warm, dry.  No rash or lesions. Neurologic: alert and oriented, normal speech, no tremor   Labs: Last hemoglobin A1c: 8.3% on 03/2022 Lab Results  Component Value Date   HGBA1C 8.3 03/19/2022     Lab Results  Component Value Date   HGBA1C 8.3 03/19/2022  HGBA1C 7.2 (A) 07/07/2021   HGBA1C 7.0 03/17/2021    Lab Results  Component Value Date   CREATININE 0.90 09/10/2020    Assessment/Plan: [name] Mcintosh is a 21 y.o. male with recently diagnosed type 1 diabetes on MDI. PUmp download shows a pattern of post prandial hyperglycemia despite good carb counting and consistent bolusing. He needs a stronger carb ratio. The insulin pump is also giving him additional basal insulin (29 units per day, programmed for 15 units per day). Hemoglobin A1c has increased to 8.3% which is higher then ADA goal of <7%.   He has experienced overnight hypoglycemia due to guessing at insulin dose and giving to strong of dose. He also has a pattern of blood sugars rising overnight when he does not give correction. He would benefit from closed loop insulin pump therapy. Hemoglobin A1c is 7.2% today.     1. Type 1 diabetes  2. Hyperglycemia  3. Hypoglycemia  - Reviewed insulin pump and CGM download. Discussed trends and patterns.  - Rotate pump sites to prevent scar tissue.  - bolus 15 minutes prior to eating to limit blood sugar spikes.  - Reviewed carb counting and importance of accurate carb counting.  - Discussed signs and symptoms of hypoglycemia. Always have glucose available.  - POCT glucose and hemoglobin A1c  - Reviewed growth chart.    4. Insulin Dose change  - Basal (Max: 1.1 units/hr) 12AM 0.60--> 0.75                           Total: 18 units    Insulin to carbohydrate ratio (ICR)  12AM 12  10am  12--> 10   9pm 12                 Max Bolus: 16 units  Increase lantus to 18 units  - Start Novolog 120/50/12 plan. At night reduce correction dose by 50%.    Follow-up:   3 months.   Medical decision-making:  >45 spent today reviewing the medical chart, counseling the patient/family, and documenting today's visit.   When a patient is on insulin, intensive monitoring of blood glucose levels is necessary to avoid hyperglycemia and hypoglycemia. Severe hyperglycemia/hypoglycemia can lead to hospital admissions and be life threatening.     Hermenia Bers,  FNP-C  Pediatric Specialist  [address] [place]  [place], [address]  Tele: [phone]

## 2022-07-17 ENCOUNTER — Ambulatory Visit (INDEPENDENT_AMBULATORY_CARE_PROVIDER_SITE_OTHER): Payer: Medicaid Other | Admitting: Family

## 2022-07-17 ENCOUNTER — Encounter (INDEPENDENT_AMBULATORY_CARE_PROVIDER_SITE_OTHER): Payer: Self-pay

## 2022-07-20 ENCOUNTER — Ambulatory Visit (INDEPENDENT_AMBULATORY_CARE_PROVIDER_SITE_OTHER): Payer: Medicaid Other | Admitting: Family

## 2022-07-20 ENCOUNTER — Encounter (INDEPENDENT_AMBULATORY_CARE_PROVIDER_SITE_OTHER): Payer: Self-pay | Admitting: Family

## 2022-07-20 VITALS — BP 120/76 | HR 69 | Wt 200.6 lb

## 2022-07-20 DIAGNOSIS — Z4681 Encounter for fitting and adjustment of insulin pump: Secondary | ICD-10-CM

## 2022-07-20 DIAGNOSIS — E1065 Type 1 diabetes mellitus with hyperglycemia: Secondary | ICD-10-CM

## 2022-07-20 LAB — POCT GLYCOSYLATED HEMOGLOBIN (HGB A1C): Hemoglobin A1C: 9.4 % — AB (ref 4.0–5.6)

## 2022-07-20 LAB — POCT GLUCOSE (DEVICE FOR HOME USE): POC Glucose: 268 mg/dl — AB (ref 70–99)

## 2022-07-20 NOTE — Patient Instructions (Signed)
It was a pleasure seeing you in clinic today. Please do not hesitate to contact me if you have questions or concerns.   Please sign up for MyChart. This is a communication tool that allows you to send an email directly to me. This can be used for questions, prescriptions and blood sugar reports. We will also release labs to you with instructions on MyChart. Please do not use MyChart if you need immediate or emergency assistance. Ask our wonderful front office staff if you need assistance.    - Basal (Max: 1.1 units/hr) 12AM 0.75   7am 0.75--> 0.9                      Total: 18 units    Insulin to carbohydrate ratio (ICR)  12AM 12  10am 10   9pm 12--> 10                  Max Bolus: 16 units     Target BG 12AM 120 --> 140    7am 110    11pm 120

## 2022-07-20 NOTE — Progress Notes (Signed)
Pediatric Endocrinology Diabetes Consultation Follow-up Visit  [name] Mcintosh 08/13/2000 [id number]  Chief Complaint: Follow-up Type 1 Diabetes    Pediatrics, [facility]   HPI: [name] Mcintosh  is a 21 y.o. male presenting for follow-up of Type 1 Diabetes   he is accompanied to this visit by his Mother  1. He was diagnosed with new onset type 1 diabetes on 06/2019. He was in the PICU initially then transitioned off insulin drip to MDI and received extensive diabetes education while at [facility]. His diabetes antibodies were negative but he had a low c-peptide.   2. Since his last visit to clinic on 03/2022 he has been well. No Er visits or hospitalizations.   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 Goes to the gym a few days per week to work out for activity.   Using Omnipod 5 with Dexcom CGM, reports they are working well. Rarely has issues with Dexcom CGM other then occasional false low alarms if he is laying on the sensor.  Tries to bolus before eating. Rotates pods every 3 days. Rarely has pump site failures.    Concerns:  Diet has been out of whack with exams the last 2 weeks. Having some low blood sugars at night.   Insulin regimen: Omnipod 5  - Basal (Max: 1.1 units/hr) 12AM 0.75                           Total: 18 units    Insulin to carbohydrate ratio (ICR)  12AM 12  10am 10   9pm 12                 Max Bolus: 16 units   Insulin Sensitivity Factor (ISF) 12AM 50  10AM 45  1PM 50                     Target BG 12AM 120   7am 110   11Pm  120                      Hypoglycemia: can feel most low blood sugars.  No glucagon needed recently.  Blood glucose download:  CGM download:   - Report shows pattern of hyperglycemia between 5pm-2 am.  Med-alert ID: is not currently wearing. Injection/Pump sites: abdomen  Annual labs due: [place]  Ophthalmology due: 2023.  Reminded to get  annual dilated eye exam    3. ROS: Greater than 10 systems reviewed with pertinent positives listed in HPI, otherwise neg. Constitutional: Good energy. Weight stable  Eyes: No changes in vision Ears/Nose/Mouth/Throat: No difficulty swallowing. Cardiovascular: No palpitations Respiratory: No increased work of breathing Gastrointestinal: No constipation or diarrhea. No abdominal pain Genitourinary: No nocturia, no polyuria Musculoskeletal: No joint pain Neurologic: Normal sensation, no tremor Endocrine: No polydipsia.  No hyperpigmentation Psychiatric: Normal affect  Past Medical History:   Past Medical History:  Diagnosis Date   Diabetes mellitus without complication ([place])     Medications:  Outpatient Encounter Medications as of 07/20/2022  Medication Sig Note   Accu-Chek FastClix Lancets MISC Check sugar 10 x daily    ACCU-CHEK GUIDE test strip USE TO CHECK BLOOD GLUCOSE 6 TIMES DAILY    acetone, urine, test strip Check ketones per protocol 03/28/2020: PRN   Alcohol Swabs (ALCOHOL PADS) 70 % PADS Use to wipe skin prior to insulin injection 7 times  daily    Blood Glucose Monitoring Suppl (ACCU-CHEK GUIDE) w/Device KIT 1 kit by Does not apply route 6 (six) times daily.    Continuous Blood Gluc Sensor (DEXCOM G6 SENSOR) MISC Change every 10 days    Continuous Blood Gluc Transmit (DEXCOM G6 TRANSMITTER) MISC CHANGE EVERY 90 DAYS.    Glucagon (BAQSIMI TWO PACK) 3 MG/DOSE POWD Place 1 application  into the nose as needed. Use as directed if unconscious, unable to take food po, or having a seizure due to hypoglycemia    insulin aspart (NOVOLOG FLEXPEN) 100 UNIT/ML FlexPen 3 month supply. Inject up to 70 units per day as prescribed    Insulin Disposable Pump (OMNIPOD 5 G6 POD, GEN 5,) MISC Change pod every 2 days. Patient will need 3 boxes (each contain 5 pods) for a 30 day supply. Please fill for [facility] [address]-21.    insulin glargine (LANTUS SOLOSTAR) 100 UNIT/ML Solostar Pen 3 month  supply. Inject up to 50 units per day    Insulin Pen Needle (BD PEN NEEDLE NANO 2ND GEN) 32G X 4 MM MISC Use to inject insulin 8 times a day    Lancets Misc. (ACCU-CHEK FASTCLIX LANCET) KIT Use to check blood sugar up to 10 times daily    NOVOLOG 100 UNIT/ML injection INJECT UP TO 200 UNITS INTO INSULIN PUMP EVERY 2 DAYS    No facility-administered encounter medications on file as of 07/20/2022.    Allergies: No Known Allergies  Surgical History: Past Surgical History:  Procedure Laterality Date   EAR TUBE REMOVAL     wisdom teeth extraction  02/25/2022    Family History:  No family history on file.    Social History: Lives with: Mother Sophomore  year at [employer].   Physical Exam:  Vitals:   07/20/22 1253  BP: 120/76  Pulse: 69  Weight: 200 lb 9.6 oz (91 kg)       BP 120/76   Pulse 69   Wt 200 lb 9.6 oz (91 kg)   BMI 28.56 kg/m  Body mass index: body mass index is 28.56 kg/m. Blood pressure %iles are not available for patients who are 18 years or older.  Ht Readings from Last 3 Encounters:  03/19/22 5' 10.28" (1.785 m) (60 %, Z= 0.24)*  08/08/21 5' 10.39" (1.788 m) (62 %, Z= 0.31)*  03/17/21 5' 10.47" (1.79 m) (64 %, Z= 0.35)*   * Growth percentiles are based on CDC (Boys, 2-20 Years) data.   Wt Readings from Last 3 Encounters:  07/20/22 200 lb 9.6 oz (91 kg) (92 %, Z= 1.40)*  03/19/22 199 lb 9.6 oz (90.5 kg) (92 %, Z= 1.41)*  08/08/21 215 lb 9.6 oz (97.8 kg) (97 %, Z= 1.82)*   * Growth percentiles are based on CDC (Boys, 2-20 Years) data.   General: Well developed, well nourished male in no acute distress.  Head: Normocephalic, atraumatic.   Eyes:  Pupils equal and round. EOMI.  Sclera white.  No eye drainage.   Ears/Nose/Mouth/Throat: Nares patent, no nasal drainage.  Normal dentition, mucous membranes moist.  Neck: supple, no cervical lymphadenopathy, no thyromegaly Cardiovascular: regular rate, normal S1/S2, no murmurs Respiratory: No increased  work of breathing.  Lungs clear to auscultation bilaterally.  No wheezes. Abdomen: soft, nontender, nondistended. Normal bowel sounds.  No appreciable masses  Extremities: warm, well perfused, cap refill < 2 sec.   Musculoskeletal: Normal muscle mass.  Normal strength Skin: warm, dry.  No rash or lesions. Neurologic: alert and oriented,  normal speech, no tremor   Labs: Last hemoglobin A1c: 8.3% on 03/2022 Lab Results  Component Value Date   HGBA1C 9.4 (A) 07/20/2022     Lab Results  Component Value Date   HGBA1C 9.4 (A) 07/20/2022   HGBA1C 8.3 03/19/2022   HGBA1C 7.2 (A) 07/07/2021    Lab Results  Component Value Date   CREATININE 0.90 09/10/2020    Assessment/Plan: [name] Mcintosh is a 21 y.o. male withtype 1 diabetes on Omnipod and Dexcom CGM. He is having patterns of hyperglycemia after dinner, needs stronger carb ratio. His report also shows that his pump is giving higher quantities of basal insulin then he is programmed for, will adjust settings today. Occasional hypoglycemia between 3am-6am over the past 1-2 weeks, will adjust his target BG. Hemoglobin A1c is 9.4% which is higher then ADA goal of <7%.    He has experienced overnight hypoglycemia due to guessing at insulin dose and giving to strong of dose. He also has a pattern of blood sugars rising overnight when he does not give correction. He would benefit from closed loop insulin pump therapy. Hemoglobin A1c is 7.2% today.     1. Type 1 diabetes  2. Hyperglycemia  - Reviewed insulin pump and CGM download. Discussed trends and patterns.  - Rotate pump sites to prevent scar tissue.  - bolus 15 minutes prior to eating to limit blood sugar spikes.  - Reviewed carb counting and importance of accurate carb counting.  - Discussed signs and symptoms of hypoglycemia. Always have glucose available.  - POCT glucose and hemoglobin A1c  - Reviewed growth chart.  - Discussed eating schedule. Encouraged to limit fast food and high  carb foods late at night which tend to raise blood sugars overnight.  - Labs; Lipid panel, TFTS, CMP  and microalbumin ordered.   4. Insulin Dose change  - Basal (Max: 1.1 units/hr) 12AM 0.75   7am 0.75--> 0.9                      Total: 18 units    Insulin to carbohydrate ratio (ICR)  12AM 12  10am 10   9pm 12--> 10                  Max Bolus: 16 units     Target BG 12AM 120 --> 140    7am 110    11pm 120                       Follow-up:   3 months. Contact me in 1-2 weeks for pump adjustments as needed.   Medical decision-making:  >45 spent today reviewing the medical chart, counseling the patient/family, and documenting today's visit.    When a patient is on insulin, intensive monitoring of blood glucose levels is necessary to avoid hyperglycemia and hypoglycemia. Severe hyperglycemia/hypoglycemia can lead to hospital admissions and be life threatening.     Hermenia Bers,  FNP-C  Pediatric Specialist  [address] [place]  [place], [address]  Tele: [phone]

## 2022-07-21 LAB — COMPLETE METABOLIC PANEL WITH GFR
AG Ratio: 1.6 (calc) (ref 1.0–2.5)
ALT: 8 U/L — ABNORMAL LOW (ref 9–46)
AST: 14 U/L (ref 10–40)
Albumin: 4.4 g/dL (ref 3.6–5.1)
Alkaline phosphatase (APISO): 67 U/L (ref 36–130)
BUN: 13 mg/dL (ref 7–25)
CO2: 28 mmol/L (ref 20–32)
Calcium: 9.4 mg/dL (ref 8.6–10.3)
Chloride: 103 mmol/L (ref 98–110)
Creat: 1.03 mg/dL (ref 0.60–1.24)
Globulin: 2.8 g/dL (calc) (ref 1.9–3.7)
Glucose, Bld: 224 mg/dL — ABNORMAL HIGH (ref 65–139)
Potassium: 4.1 mmol/L (ref 3.5–5.3)
Sodium: 141 mmol/L (ref 135–146)
Total Bilirubin: 3.7 mg/dL — ABNORMAL HIGH (ref 0.2–1.2)
Total Protein: 7.2 g/dL (ref 6.1–8.1)
eGFR: 107 mL/min/{1.73_m2} (ref >=60)

## 2022-07-21 LAB — LIPID PANEL
Cholesterol: 100 mg/dL (ref <200)
HDL: 57 mg/dL (ref >=40)
LDL Cholesterol (Calc): 29 mg/dL (calc)
Non-HDL Cholesterol (Calc): 43 mg/dL (calc) (ref <130)
Total CHOL/HDL Ratio: 1.8 (calc) (ref <5.0)
Triglycerides: 68 mg/dL (ref <150)

## 2022-07-21 LAB — MICROALBUMIN / CREATININE URINE RATIO
Creatinine, Urine: 517 mg/dL — ABNORMAL HIGH (ref 20–320)
Microalb Creat Ratio: 9 mcg/mg creat (ref <30)
Microalb, Ur: 4.7 mg/dL

## 2022-07-21 LAB — T4, FREE: Free T4: 1.1 ng/dL (ref 0.8–1.4)

## 2022-07-21 LAB — TSH: TSH: 0.78 mIU/L (ref 0.40–4.50)

## 2022-09-03 ENCOUNTER — Other Ambulatory Visit (INDEPENDENT_AMBULATORY_CARE_PROVIDER_SITE_OTHER): Payer: Self-pay | Admitting: Family

## 2022-09-03 DIAGNOSIS — Z4681 Encounter for fitting and adjustment of insulin pump: Secondary | ICD-10-CM

## 2022-10-01 ENCOUNTER — Other Ambulatory Visit (INDEPENDENT_AMBULATORY_CARE_PROVIDER_SITE_OTHER): Payer: Self-pay | Admitting: Family

## 2022-10-01 DIAGNOSIS — E109 Type 1 diabetes mellitus without complications: Secondary | ICD-10-CM

## 2022-10-01 DIAGNOSIS — E10649 Type 1 diabetes mellitus with hypoglycemia without coma: Secondary | ICD-10-CM

## 2022-10-01 DIAGNOSIS — E1065 Type 1 diabetes mellitus with hyperglycemia: Secondary | ICD-10-CM

## 2022-10-02 ENCOUNTER — Encounter (INDEPENDENT_AMBULATORY_CARE_PROVIDER_SITE_OTHER): Payer: Self-pay

## 2022-10-05 ENCOUNTER — Telehealth (INDEPENDENT_AMBULATORY_CARE_PROVIDER_SITE_OTHER): Payer: Self-pay | Admitting: Family

## 2022-10-05 NOTE — Telephone Encounter (Signed)
Who's calling (name and relationship to patient) : Jorawar Stute; self  Best contact number: [phone]  Provider they see: Gwynneth Aliment   Reason for call: Dorr is needing a refill for transmitter for Dexcom. He also stated that he sent a Mychart message.    Call ID:      PRESCRIPTION REFILL ONLY  Name of prescription:  Pharmacy:

## 2022-10-06 ENCOUNTER — Encounter (INDEPENDENT_AMBULATORY_CARE_PROVIDER_SITE_OTHER): Payer: Self-pay

## 2022-10-06 ENCOUNTER — Telehealth (INDEPENDENT_AMBULATORY_CARE_PROVIDER_SITE_OTHER): Payer: Self-pay

## 2022-10-06 DIAGNOSIS — E1065 Type 1 diabetes mellitus with hyperglycemia: Secondary | ICD-10-CM

## 2022-10-06 MED ORDER — OMNIPOD 5 DEXG7G6 PODS GEN 5 MISC: 5 refills | Status: DC

## 2022-10-06 NOTE — Telephone Encounter (Signed)
Patient sent message requesting PA, initiated PA on covermymeds

## 2022-10-13 NOTE — Telephone Encounter (Signed)
Patient notified by mychart.

## 2022-10-20 ENCOUNTER — Ambulatory Visit (INDEPENDENT_AMBULATORY_CARE_PROVIDER_SITE_OTHER): Payer: Medicaid Other | Admitting: Family

## 2022-10-30 ENCOUNTER — Ambulatory Visit (INDEPENDENT_AMBULATORY_CARE_PROVIDER_SITE_OTHER): Payer: Medicaid Other | Admitting: Family

## 2023-02-02 ENCOUNTER — Ambulatory Visit (INDEPENDENT_AMBULATORY_CARE_PROVIDER_SITE_OTHER): Payer: Medicaid Other | Admitting: Family

## 2023-02-02 ENCOUNTER — Encounter (INDEPENDENT_AMBULATORY_CARE_PROVIDER_SITE_OTHER): Payer: Self-pay | Admitting: Family

## 2023-02-02 VITALS — BP 122/80 | HR 76 | Wt 211.4 lb

## 2023-02-02 DIAGNOSIS — Z4681 Encounter for fitting and adjustment of insulin pump: Secondary | ICD-10-CM

## 2023-02-02 DIAGNOSIS — E1065 Type 1 diabetes mellitus with hyperglycemia: Secondary | ICD-10-CM | POA: Diagnosis not present

## 2023-02-02 LAB — POCT GLYCOSYLATED HEMOGLOBIN (HGB A1C): Hemoglobin A1C: 7.5 % — AB (ref 4.0–5.6)

## 2023-02-02 LAB — POCT GLUCOSE (DEVICE FOR HOME USE): POC Glucose: 104 mg/dl — AB (ref 70–99)

## 2023-02-02 NOTE — Progress Notes (Signed)
Pediatric Endocrinology Diabetes Consultation Follow-up Visit  [name] Mcintosh [birth year] [id number]  Chief Complaint: Follow-up Type 1 Diabetes    Pediatrics, [name] Mcintosh   HPI: [name] Mcintosh  is a 22 y.o. male presenting for follow-up of Type 1 Diabetes   he is accompanied to this visit by his Mother  1. He was diagnosed with new onset type 1 diabetes on 06/2019. He was in the PICU initially then transitioned off insulin drip to MDI and received extensive diabetes education while at [facility]. His diabetes antibodies were negative but he had a low c-peptide.   2. Since his last visit to clinic on 07/2022 he has been well. No Er visits or hospitalizations.   He is home for summer break currently, will go back to school in August. He will be living in an apartment with 2 roommates. 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 He has been staying active, frequently walking and playing.   Reports that diabetes care has been "alright". Omnipod 5 is working well, he occasionally has pods fall of due to heat and sweat. He is bolusing before eating most of the time, rarely forgets to bolus. Estimates he carb intake per meal is around 70 grams. He rarely has low blood sugars, he is able to feel symptoms when he is under 80.   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 He is back in auto mode now.    Insulin regimen: Omnipod 5  - Basal (Max: 1.1 units/hr) 12AM 0.75   7am 0.9                      Total: 1units    Insulin to carbohydrate ratio (ICR)  12AM 12  10am 10   9pm 10                  Max Bolus: 16 units    Target BG 12AM 120   7am 110   11Pm  120                      Hypoglycemia: can feel most low blood sugars.  No glucagon needed recently.  Blood glucose download:  CGM download:   - Report shows pattern of hyperglycemia between 5pm-2 am.  Med-alert ID: is not currently wearing. Injection/Pump sites:  abdomen  Annual labs due: ORDERED  Ophthalmology due: 2023.  Reminded to get annual dilated eye exam    3. ROS: Greater than 10 systems reviewed with pertinent positives listed in HPI, otherwise neg. Constitutional: Good energy. Weight stable  Eyes: No changes in vision Ears/Nose/Mouth/Throat: No difficulty swallowing. Cardiovascular: No palpitations Respiratory: No increased work of breathing Gastrointestinal: No constipation or diarrhea. No abdominal pain Genitourinary: No nocturia, no polyuria Musculoskeletal: No joint pain Neurologic: Normal sensation, no tremor Endocrine: No polydipsia.  No hyperpigmentation Psychiatric: Normal affect  Past Medical History:   Past Medical History:  Diagnosis Date   Diabetes mellitus without complication (HCC)     Medications:  Outpatient Encounter Medications as of 02/02/2023  Medication Sig Note   Accu-Chek FastClix Lancets MISC Check sugar 10 x daily    ACCU-CHEK GUIDE test strip USE TO CHECK BLOOD GLUCOSE 6 TIMES DAILY    acetone, urine, test strip Check ketones per protocol 03/28/2020: PRN   Alcohol Swabs (ALCOHOL PADS) 70 % PADS Use to wipe skin prior to insulin injection 7 times daily  BAQSIMI TWO PACK 3 MG/DOSE POWD PLACE 1 APPLICATION INTO THE NOSE AS NEEDED. USE AS DIRECTED IF UNCONSCIOUS, UNABLE TO TAKE FOOD, OR HAVING A SEIZURE DUE TO HYPOGLYCEMIA    Blood Glucose Monitoring Suppl (ACCU-CHEK GUIDE) w/Device KIT 1 kit by Does not apply route 6 (six) times daily.    Continuous Blood Gluc Sensor (DEXCOM G6 SENSOR) MISC Change every 10 days    Continuous Blood Gluc Transmit (DEXCOM G6 TRANSMITTER) MISC CHANGE EVERY 90 DAYS.    insulin aspart (NOVOLOG FLEXPEN) 100 UNIT/ML FlexPen 3 MONTH SUPPLY. INJECT UP TO 70 UNITS PER DAY AS PRESCRIBED    Insulin Disposable Pump (OMNIPOD 5 G6 PODS, GEN 5,) MISC Change pod every 2 days. Patient will need 3 boxes (each contain 5 pods) for a 30 day supply. Please fill for [facility] [id number].    insulin  glargine (LANTUS SOLOSTAR) 100 UNIT/ML Solostar Pen 3 month supply. Inject up to 50 units per day    Insulin Pen Needle (BD PEN NEEDLE NANO 2ND GEN) 32G X 4 MM MISC Use to inject insulin 8 times a day    Lancets Misc. (ACCU-CHEK FASTCLIX LANCET) KIT Use to check blood sugar up to 10 times daily    NOVOLOG 100 UNIT/ML injection INJECT UP TO 200 UNITS INTO INSULIN PUMP EVERY 2 DAYS    No facility-administered encounter medications on file as of 02/02/2023.    Allergies: No Known Allergies  Surgical History: Past Surgical History:  Procedure Laterality Date   EAR TUBE REMOVAL     wisdom teeth extraction  02/25/2022    Family History:  No family history on file.    Social History: Lives with: Mother Sophomore  year at [employer].   Physical Exam:  Vitals:   02/02/23 1424  BP: 122/80  Pulse: 76  Weight: 211 lb 6.4 oz (95.9 kg)        BP 122/80   Pulse 76   Wt 211 lb 6.4 oz (95.9 kg)   BMI 30.09 kg/m  Body mass index: body mass index is 30.09 kg/m. Growth %ile SmartLinks can only be used for patients less than 5 years old.  Ht Readings from Last 3 Encounters:  03/19/22 5' 10.28" (1.785 m) (60 %, Z= 0.24)*  08/08/21 5' 10.39" (1.788 m) (62 %, Z= 0.31)*  03/17/21 5' 10.47" (1.79 m) (64 %, Z= 0.35)*   * Growth percentiles are based on CDC (Boys, 2-20 Years) data.   Wt Readings from Last 3 Encounters:  02/02/23 211 lb 6.4 oz (95.9 kg)  07/20/22 200 lb 9.6 oz (91 kg) (92 %, Z= 1.40)*  03/19/22 199 lb 9.6 oz (90.5 kg) (92 %, Z= 1.41)*   * Growth percentiles are based on CDC (Boys, 2-20 Years) data.   General: Well developed, well nourished male in no acute distress.   Head: Normocephalic, atraumatic.   Eyes:  Pupils equal and round. EOMI.  Sclera white.  No eye drainage.   Ears/Nose/Mouth/Throat: Nares patent, no nasal drainage.  Normal dentition, mucous membranes moist.  Neck: supple, no cervical lymphadenopathy, no thyromegaly Cardiovascular: regular rate, normal  S1/S2, no murmurs Respiratory: No increased work of breathing.  Lungs clear to auscultation bilaterally.  No wheezes. Abdomen: soft, nontender, nondistended. Normal bowel sounds.  No appreciable masses  Extremities: warm, well perfused, cap refill < 2 sec.   Musculoskeletal: Normal muscle mass.  Normal strength Skin: warm, dry.  No rash or lesions. Neurologic: alert and oriented, normal speech, no tremor    Labs: Last  hemoglobin A1c: 9.4% on 07/2022 Lab Results  Component Value Date   HGBA1C 7.5 (A) 02/02/2023     Lab Results  Component Value Date   HGBA1C 7.5 (A) 02/02/2023   HGBA1C 9.4 (A) 07/20/2022   HGBA1C 8.3 03/19/2022    Lab Results  Component Value Date   MICROALBUR 4.7 07/20/2022   LDLCALC 29 07/20/2022   CREATININE 1.03 07/20/2022    Assessment/Plan: Shanna is a 22 y.o. male withtype 1 diabetes on Omnipod and Dexcom CGM. He has made improvements with diabetes care, hemoglobin A1c has improved to 7.5% but is higher then ADA goal of <7%. He has a pattern of post prandial hyperglycemia between 3pm-12am.    1. Type 1 diabetes  2. Hyperglycemia  - Reviewed insulin pump and CGM download. Discussed trends and patterns.  - Rotate pump sites to prevent scar tissue.  - bolus 15 minutes prior to eating to limit blood sugar spikes.  - Reviewed carb counting and importance of accurate carb counting.  - Discussed signs and symptoms of hypoglycemia. Always have glucose available.  - POCT glucose and hemoglobin A1c  - Reviewed growth chart.  - Advised to use exercise mode on Omnipod pump instead of taking out of auto mode. Exercise mode allows pump to suspend if he is going low and raises glucose target to 150.  -   4. Insulin Dose change  - Basal (Max: 1.1 units/hr) 12AM 0.75--> 0.85   7am 0.9--> 1.0                      Total: 22.5units    Insulin to carbohydrate ratio (ICR)  12AM 12  10am 10   6pm 10 --> 9                  Max Bolus: 16 units     Correction Factor 12AM 45  10am 45--> 40   1Pm  45--> 40                    Target BG 12AM 140 --> 130    7am 110    11pm 120                      Back up plan  Tresiba 22 units  Novolog ICR 1:10     ISF: 1:40 >120 (day) and 180 (night)   Follow-up:   3 months.   Medical decision-making:  LOS:>40  spent today reviewing the medical chart, counseling the patient/family, and documenting today's visit.     When a patient is on insulin, intensive monitoring of blood glucose levels is necessary to avoid hyperglycemia and hypoglycemia. Severe hyperglycemia/hypoglycemia can lead to hospital admissions and be life threatening.     Gretchen Short,  FNP-C  Pediatric Specialist  [address]  [place], [address]  Tele: [phone]

## 2023-02-02 NOTE — Patient Instructions (Addendum)
-   Basal (Max: 1.1 units/hr) 12AM 0.75--> 0.85   7am 0.9--> 1.0                      Total: 22.5 units    Insulin to carbohydrate ratio (ICR)  12AM 12  10am 10   6pm 10 --> 9                  Max Bolus: 16 units    Correction Factor 12AM 45  10am 45--> 40   1Pm  45--> 40                    Target BG 12AM 140 --> 130    7am 110    11pm 120

## 2023-02-04 ENCOUNTER — Encounter (INDEPENDENT_AMBULATORY_CARE_PROVIDER_SITE_OTHER): Payer: Self-pay

## 2023-02-05 ENCOUNTER — Telehealth (INDEPENDENT_AMBULATORY_CARE_PROVIDER_SITE_OTHER): Payer: Self-pay

## 2023-02-05 MED ORDER — DEXCOM G6 TRANSMITTER MISC: 2 refills | Status: DC

## 2023-02-05 NOTE — Telephone Encounter (Signed)
Refill sent. My chart sent to notify patient.

## 2023-02-12 ENCOUNTER — Encounter (INDEPENDENT_AMBULATORY_CARE_PROVIDER_SITE_OTHER): Payer: Self-pay

## 2023-02-24 ENCOUNTER — Other Ambulatory Visit (INDEPENDENT_AMBULATORY_CARE_PROVIDER_SITE_OTHER): Payer: Self-pay | Admitting: Family

## 2023-02-24 ENCOUNTER — Other Ambulatory Visit (INDEPENDENT_AMBULATORY_CARE_PROVIDER_SITE_OTHER): Payer: Self-pay | Admitting: Pediatrics

## 2023-02-24 ENCOUNTER — Encounter (INDEPENDENT_AMBULATORY_CARE_PROVIDER_SITE_OTHER): Payer: Self-pay

## 2023-02-24 DIAGNOSIS — E1065 Type 1 diabetes mellitus with hyperglycemia: Secondary | ICD-10-CM

## 2023-02-24 DIAGNOSIS — E109 Type 1 diabetes mellitus without complications: Secondary | ICD-10-CM

## 2023-02-24 DIAGNOSIS — Z4681 Encounter for fitting and adjustment of insulin pump: Secondary | ICD-10-CM

## 2023-02-24 DIAGNOSIS — E10649 Type 1 diabetes mellitus with hypoglycemia without coma: Secondary | ICD-10-CM

## 2023-02-24 MED ORDER — DEXCOM G6 SENSOR MISC: 11 refills | Status: DC

## 2023-02-25 ENCOUNTER — Encounter (INDEPENDENT_AMBULATORY_CARE_PROVIDER_SITE_OTHER): Payer: Self-pay

## 2023-03-12 ENCOUNTER — Encounter (INDEPENDENT_AMBULATORY_CARE_PROVIDER_SITE_OTHER): Payer: Self-pay

## 2023-03-23 ENCOUNTER — Encounter (INDEPENDENT_AMBULATORY_CARE_PROVIDER_SITE_OTHER): Payer: Self-pay

## 2023-04-14 ENCOUNTER — Encounter (INDEPENDENT_AMBULATORY_CARE_PROVIDER_SITE_OTHER): Payer: Self-pay

## 2023-04-14 DIAGNOSIS — E1065 Type 1 diabetes mellitus with hyperglycemia: Secondary | ICD-10-CM

## 2023-04-14 MED ORDER — OMNIPOD 5 DEXG7G6 PODS GEN 5 MISC
5 refills | Status: DC
Start: 2023-04-14 — End: 2023-11-09

## 2023-05-05 ENCOUNTER — Telehealth (INDEPENDENT_AMBULATORY_CARE_PROVIDER_SITE_OTHER): Payer: Medicaid Other | Admitting: Family

## 2023-05-05 NOTE — Progress Notes (Deleted)
Pediatric Endocrinology Diabetes Consultation Follow-up Visit  [name] Mcintosh [birth year] [id number]  Chief Complaint: Follow-up Type 1 Diabetes    Pediatrics, Blima Rich   HPI: [name] Mcintosh  is a 22 y.o. male presenting for follow-up of Type 1 Diabetes   he is accompanied to this visit by his Mother  1. He was diagnosed with new onset type 1 diabetes on 06/2019. He was in the PICU initially then transitioned off insulin drip to MDI and received extensive diabetes education while at [facility]. His diabetes antibodies were negative but he had a low c-peptide.   2. Since his last visit to clinic on 01/2023 he has been well. No Er visits or hospitalizations.   He is home for summer break currently, will go back to school in August. He will be living in an apartment with 2 roommates. 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 He has been staying active, frequently walking and playing.   Reports that diabetes care has been "alright". Omnipod 5 is working well, he occasionally has pods fall of due to heat and sweat. He is bolusing before eating most of the time, rarely forgets to bolus. Estimates he carb intake per meal is around 70 grams. He rarely has low blood sugars, he is able to feel symptoms when he is under 80.   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 He is back in auto mode now.    Insulin regimen: Omnipod 5  - Basal (Max: 1.1 units/hr) 12AM 0.75--> 0.85   7am 0.9--> 1.0                      Total: 22.5units    Insulin to carbohydrate ratio (ICR)  12AM 12  10am 10   6pm 9                  Max Bolus: 16 units    Correction Factor 12AM 45  10am 40   1Pm  40                    Target BG 12AM 130    7am 110    11pm 120                      Hypoglycemia: can feel most low blood sugars.  No glucagon needed recently.  Blood glucose download:  CGM download:   - Report shows pattern of hyperglycemia  between 5pm-2 am.  Med-alert ID: is not currently wearing. Injection/Pump sites: abdomen  Annual labs due: ORDERED  Ophthalmology due: 2023.  Reminded to get annual dilated eye exam    3. ROS: Greater than 10 systems reviewed with pertinent positives listed in HPI, otherwise neg. Constitutional: Good energy. Weight stable  Eyes: No changes in vision Ears/Nose/Mouth/Throat: No difficulty swallowing. Cardiovascular: No palpitations Respiratory: No increased work of breathing Gastrointestinal: No constipation or diarrhea. No abdominal pain Genitourinary: No nocturia, no polyuria Musculoskeletal: No joint pain Neurologic: Normal sensation, no tremor Endocrine: No polydipsia.  No hyperpigmentation Psychiatric: Normal affect  Past Medical History:   Past Medical History:  Diagnosis Date   Diabetes mellitus without complication (HCC)     Medications:  Outpatient Encounter Medications as of 05/05/2023  Medication Sig Note   Accu-Chek FastClix Lancets MISC Check sugar 10 x daily    ACCU-CHEK GUIDE test strip USE TO CHECK BLOOD GLUCOSE 6 TIMES DAILY  acetone, urine, test strip Check ketones per protocol 03/28/2020: PRN   Alcohol Swabs (ALCOHOL PADS) 70 % PADS Use to wipe skin prior to insulin injection 7 times daily    BAQSIMI TWO PACK 3 MG/DOSE POWD PLACE 1 APPLICATION INTO THE NOSE AS NEEDED. USE AS DIRECTED IF UNCONSCIOUS, UNABLE TO TAKE FOOD, OR HAVING A SEIZURE DUE TO HYPOGLYCEMIA    Blood Glucose Monitoring Suppl (ACCU-CHEK GUIDE) w/Device KIT 1 kit by Does not apply route 6 (six) times daily.    Continuous Glucose Sensor (DEXCOM G6 SENSOR) MISC Change every 10 days    Continuous Glucose Transmitter (DEXCOM G6 TRANSMITTER) MISC CHANGE EVERY 90 DAYS.    insulin aspart (NOVOLOG FLEXPEN) 100 UNIT/ML FlexPen INJECT UP TO 70 UNITS PER DAY AS PRESCRIBED    Insulin Disposable Pump (OMNIPOD 5 G6 PODS, GEN 5,) MISC Change pod every 2 days. Patient will need 3 boxes (each contain 5 pods) for  a 30 day supply. Please fill for [facility] [id number].    insulin glargine (LANTUS SOLOSTAR) 100 UNIT/ML Solostar Pen 3 month supply. Inject up to 50 units per day    Insulin Pen Needle (BD PEN NEEDLE NANO 2ND GEN) 32G X 4 MM MISC Use to inject insulin 8 times a day    Lancets Misc. (ACCU-CHEK FASTCLIX LANCET) KIT Use to check blood sugar up to 10 times daily    NOVOLOG 100 UNIT/ML injection INJECT UP TO 200 UNITS INTO INSULIN PUMP EVERY 2 DAYS    No facility-administered encounter medications on file as of 05/05/2023.    Allergies: No Known Allergies  Surgical History: Past Surgical History:  Procedure Laterality Date   EAR TUBE REMOVAL     wisdom teeth extraction  02/25/2022    Family History:  No family history on file.    Social History: Lives with: Mother Sophomore  year at [employer].   Physical Exam:  There were no vitals filed for this visit.       There were no vitals taken for this visit. Body mass index: body mass index is unknown because there is no height or weight on file. Growth %ile SmartLinks can only be used for patients less than 45 years old.  Ht Readings from Last 3 Encounters:  03/19/22 5' 10.28" (1.785 m) (60%, Z= 0.24)*  08/08/21 5' 10.39" (1.788 m) (62%, Z= 0.31)*  03/17/21 5' 10.47" (1.79 m) (64%, Z= 0.35)*   * Growth percentiles are based on CDC (Boys, 2-20 Years) data.   Wt Readings from Last 3 Encounters:  02/02/23 211 lb 6.4 oz (95.9 kg)  07/20/22 200 lb 9.6 oz (91 kg) (92%, Z= 1.40)*  03/19/22 199 lb 9.6 oz (90.5 kg) (92%, Z= 1.41)*   * Growth percentiles are based on CDC (Boys, 2-20 Years) data.   General: Well developed, well nourished male in no acute distress.   Head: Normocephalic, atraumatic.   Eyes:  Pupils equal and round. EOMI.  Sclera white.  No eye drainage.   Ears/Nose/Mouth/Throat: Nares patent, no nasal drainage.  Normal dentition, mucous membranes moist.  Neck: supple, no cervical lymphadenopathy, no  thyromegaly Cardiovascular: regular rate, normal S1/S2, no murmurs Respiratory: No increased work of breathing.  Lungs clear to auscultation bilaterally.  No wheezes. Abdomen: soft, nontender, nondistended. Normal bowel sounds.  No appreciable masses  Extremities: warm, well perfused, cap refill < 2 sec.   Musculoskeletal: Normal muscle mass.  Normal strength Skin: warm, dry.  No rash or lesions. Neurologic: alert and oriented, normal speech, no  tremor    Labs: Last hemoglobin A1c:7.5% on 01/2023 Lab Results  Component Value Date   HGBA1C 7.5 (A) 02/02/2023     Lab Results  Component Value Date   HGBA1C 7.5 (A) 02/02/2023   HGBA1C 9.4 (A) 07/20/2022   HGBA1C 8.3 03/19/2022    Lab Results  Component Value Date   MICROALBUR 4.7 07/20/2022   LDLCALC 29 07/20/2022   CREATININE 1.03 07/20/2022    Assessment/Plan: Dakhari is a 22 y.o. male withtype 1 diabetes on Omnipod and Dexcom CGM. He has made improvements with diabetes care, hemoglobin A1c has improved to 7.5% but is higher then ADA goal of <7%. He has a pattern of post prandial hyperglycemia between 3pm-12am.    1. Type 1 diabetes  2. Hyperglycemia  - Reviewed insulin pump and CGM download. Discussed trends and patterns.  - Rotate pump sites to prevent scar tissue.  - bolus 15 minutes prior to eating to limit blood sugar spikes.  - Reviewed carb counting and importance of accurate carb counting.  - Discussed signs and symptoms of hypoglycemia. Always have glucose available.  - POCT glucose and hemoglobin A1c  - Reviewed growth chart.   4. Insulin Dose change  - Basal (Max: 1.1 units/hr) 12AM 0.75--> 0.85   7am 0.9--> 1.0                      Total: 22.5units    Insulin to carbohydrate ratio (ICR)  12AM 12  10am 10   6pm 10 --> 9                  Max Bolus: 16 units    Correction Factor 12AM 45  10am 45--> 40   1Pm  45--> 40                    Target BG 12AM 140 --> 130    7am 110    11pm  120                      Back up plan  Tresiba 22 units  Novolog ICR 1:10     ISF: 1:40 >120 (day) and 180 (night)   Follow-up:   3 months.   Medical decision-making:  LOS:>40  spent today reviewing the medical chart, counseling the patient/family, and documenting today's visit.     When a patient is on insulin, intensive monitoring of blood glucose levels is necessary to avoid hyperglycemia and hypoglycemia. Severe hyperglycemia/hypoglycemia can lead to hospital admissions and be life threatening.     Gretchen Short,  FNP-C  Pediatric Specialist  [address]  [place], [address]  Tele: [phone]

## 2023-08-25 ENCOUNTER — Encounter (INDEPENDENT_AMBULATORY_CARE_PROVIDER_SITE_OTHER): Payer: Self-pay

## 2023-10-14 ENCOUNTER — Ambulatory Visit (INDEPENDENT_AMBULATORY_CARE_PROVIDER_SITE_OTHER): Payer: Medicaid Other | Admitting: Family

## 2023-10-22 ENCOUNTER — Other Ambulatory Visit (HOSPITAL_COMMUNITY): Payer: Self-pay

## 2023-10-22 ENCOUNTER — Telehealth (INDEPENDENT_AMBULATORY_CARE_PROVIDER_SITE_OTHER): Payer: Self-pay | Admitting: Pharmacy Technician

## 2023-10-22 NOTE — Telephone Encounter (Signed)
 Pharmacy Patient Advocate Encounter  Received notification from [facility] MEDICAID that Prior Authorization for Dexcom G6 Sensor has been APPROVED from 10/22/2023 to 10/21/2024. Ran test claim, Copay is $0.00. This test claim was processed through [facility]- copay amounts may vary at other pharmacies due to pharmacy/plan contracts, or as the patient moves through the different stages of their insurance plan.   PA #/Case ID/Reference #: [id number]

## 2023-10-22 NOTE — Telephone Encounter (Signed)
 Pharmacy Patient Advocate Encounter   Received notification from CoverMyMeds that prior authorization for Dexcom G6 Sensor is required/requested.   Insurance verification completed.   The patient is insured through [facility] MEDICAID .   Per test claim: PA required; PA submitted to above mentioned insurance via CoverMyMeds Key/confirmation #/EOC [id number] Status is pending

## 2023-10-30 ENCOUNTER — Encounter (INDEPENDENT_AMBULATORY_CARE_PROVIDER_SITE_OTHER): Payer: Self-pay

## 2023-11-09 ENCOUNTER — Encounter (INDEPENDENT_AMBULATORY_CARE_PROVIDER_SITE_OTHER): Payer: Self-pay | Admitting: Family

## 2023-11-09 ENCOUNTER — Ambulatory Visit (INDEPENDENT_AMBULATORY_CARE_PROVIDER_SITE_OTHER): Payer: Medicaid Other | Admitting: Family

## 2023-11-09 VITALS — BP 130/80 | HR 94 | Wt 205.8 lb

## 2023-11-09 DIAGNOSIS — Z4681 Encounter for fitting and adjustment of insulin pump: Secondary | ICD-10-CM

## 2023-11-09 DIAGNOSIS — E1065 Type 1 diabetes mellitus with hyperglycemia: Secondary | ICD-10-CM | POA: Diagnosis not present

## 2023-11-09 LAB — POCT GLYCOSYLATED HEMOGLOBIN (HGB A1C): Hemoglobin A1C: 8 % — AB (ref 4.0–5.6)

## 2023-11-09 LAB — POCT GLUCOSE (DEVICE FOR HOME USE): Glucose Fasting, POC: 186 mg/dL — AB (ref 70–99)

## 2023-11-09 MED ORDER — OMNIPOD 5 DEXG7G6 PODS GEN 5 MISC: 5 refills | Status: DC

## 2023-11-09 MED ORDER — DEXCOM G6 SENSOR MISC: 11 refills | Status: AC

## 2023-11-09 MED ORDER — ACCU-CHEK GUIDE W/DEVICE KIT: 1.0000 | PACK | Freq: Every day | 1 refills | Status: AC

## 2023-11-09 MED ORDER — INSULIN ASPART 100 UNIT/ML IJ SOLN: INTRAMUSCULAR | 5 refills | Status: DC

## 2023-11-09 MED ORDER — DEXCOM G6 TRANSMITTER MISC: 2 refills | Status: AC

## 2023-11-09 NOTE — Progress Notes (Signed)
 Pediatric Endocrinology Diabetes Consultation Follow-up Visit  [name] Mcintosh NURSE [birth year] [id number]  Chief Complaint: Follow-up Type 1 Diabetes    Pediatrics, [facility]   HPI: [name] Mcintosh  is a 23 y.o. male presenting for follow-up of Type 1 Diabetes   he is accompanied to this visit by his Mother  1. He was diagnosed with new onset type 1 diabetes on 06/2019. He was in the PICU initially then transitioned off insulin drip to MDI and received extensive diabetes education while at [facility]. His diabetes antibodies were negative but he had a low c-peptide.   2. Since his last visit to clinic on 01/2023 he has been well. No Er visits or hospitalizations. He no showed appointment on 05/06/2023 and cancelled appointment on 10/14/2023.   He has been busy with school, majoring in Poly sci, he is currently a Holiday representative. He works out at [employer] at least 5 days per week. His diet has been good overall, he is cooking most meals at home.   Using Omnipod 5 insulin pump and Dexcom G6, overall they are working well. He did have to replace his Omnipod 5 PDM recently. He usually boluses before eating, rarely forgets to bolus. At meals he estimats 20-40 grams per meal. He is not having frequent hypoglycemia and is able to feel symptoms when he is under 100. None severe or requiring glucagon.     Insulin regimen: Omnipod 5  - Basal (Max: 2.25 units/hr) 12AM 0.85   7am 1.0                      Total: 22.5units    Insulin to carbohydrate ratio (ICR)  12AM 12  10am 10   6pm 9                  Max Bolus: 16 units    Correction Factor 12AM 45  10am 40   1Pm  40                    Target BG 12AM 130    7am 110    11pm 120                      Hypoglycemia: can feel most low blood sugars.  No glucagon needed recently.  Blood glucose download:  CGM download:    Med-alert ID: is not currently wearing. Injection/Pump sites: abdomen  Annual labs due: ORDERED  Ophthalmology due: 2023  Discussed importance of annual eye exam today. .  Reminded to get annual dilated eye exam    3. ROS: Greater than 10 systems reviewed with pertinent positives listed in HPI, otherwise neg. All systems reviewed with pertinent positives listed below; otherwise negative. Constitutional: Energy is good, sleeping well.  HEENT: No vision changes. No difficulty swallowing.  Respiratory: No increased work of breathing currently GI: No constipation or diarrhea Musculoskeletal: No joint deformity Neuro: Normal affect. No tremors.  Endocrine: As above   Past Medical History:   Past Medical History:  Diagnosis Date   Diabetes mellitus without complication (HCC)     Medications:  Outpatient Encounter Medications as of 11/09/2023  Medication Sig Note   Accu-Chek FastClix Lancets MISC Check sugar 10 x daily    ACCU-CHEK GUIDE test strip USE TO CHECK BLOOD GLUCOSE 6 TIMES DAILY    acetone, urine, test strip Check ketones per protocol 03/28/2020: PRN   Alcohol Swabs (ALCOHOL PADS) 70 % PADS Use to wipe skin prior  to insulin injection 7 times daily    BAQSIMI TWO PACK 3 MG/DOSE POWD PLACE 1 APPLICATION INTO THE NOSE AS NEEDED. USE AS DIRECTED IF UNCONSCIOUS, UNABLE TO TAKE FOOD, OR HAVING A SEIZURE DUE TO HYPOGLYCEMIA    Blood Glucose Monitoring Suppl (ACCU-CHEK GUIDE) w/Device KIT 1 kit by Does not apply route 6 (six) times daily.    Continuous Glucose Sensor (DEXCOM G6 SENSOR) MISC Change every 10 days    Continuous Glucose Transmitter (DEXCOM G6 TRANSMITTER) MISC CHANGE EVERY 90 DAYS.    insulin aspart (NOVOLOG FLEXPEN) 100 UNIT/ML FlexPen INJECT UP TO 70 UNITS PER DAY AS PRESCRIBED    Insulin Disposable Pump (OMNIPOD 5 G6 PODS, GEN 5,) MISC Change pod every 2 days. Patient will need 3 boxes (each contain 5 pods) for a 30 day supply. Please fill for [facility] [address]-21.    insulin glargine (LANTUS SOLOSTAR) 100 UNIT/ML Solostar Pen 3 month supply. Inject up to 50 units per day    Insulin Pen Needle (BD  PEN NEEDLE NANO 2ND GEN) 32G X 4 MM MISC Use to inject insulin 8 times a day    Lancets Misc. (ACCU-CHEK FASTCLIX LANCET) KIT Use to check blood sugar up to 10 times daily    NOVOLOG 100 UNIT/ML injection INJECT UP TO 200 UNITS INTO INSULIN PUMP EVERY 2 DAYS    No facility-administered encounter medications on file as of 11/09/2023.    Allergies: No Known Allergies  Surgical History: Past Surgical History:  Procedure Laterality Date   EAR TUBE REMOVAL     wisdom teeth extraction  02/25/2022    Family History:  No family history on file.    Social History: Lives with: Mother Junior  year at [facility].   Physical Exam:  There were no vitals filed for this visit.   There were no vitals taken for this visit. Body mass index: body mass index is unknown because there is no height or weight on file. Growth %ile SmartLinks can only be used for patients less than 17 years old.  Ht Readings from Last 3 Encounters:  03/19/22 5' 10.28" (1.785 m) (60%, Z= 0.24)*  08/08/21 5' 10.39" (1.788 m) (62%, Z= 0.31)*  03/17/21 5' 10.47" (1.79 m) (64%, Z= 0.35)*   * Growth percentiles are based on CDC (Boys, 2-20 Years) data.   Wt Readings from Last 3 Encounters:  02/02/23 211 lb 6.4 oz (95.9 kg)  07/20/22 200 lb 9.6 oz (91 kg) (92%, Z= 1.40)*  03/19/22 199 lb 9.6 oz (90.5 kg) (92%, Z= 1.41)*   * Growth percentiles are based on CDC (Boys, 2-20 Years) data.   General: Well developed, well nourished male in no acute distress.   Head: Normocephalic, atraumatic.   Eyes:  Pupils equal and round. EOMI.  Sclera white.  No eye drainage.   Ears/Nose/Mouth/Throat: Nares patent, no nasal drainage.  Normal dentition, mucous membranes moist.  Neck: supple, no cervical lymphadenopathy, no thyromegaly Cardiovascular: regular rate, normal S1/S2, no murmurs Respiratory: No increased work of breathing.  Lungs clear to auscultation bilaterally.  No wheezes. Abdomen: soft, nontender, nondistended. Normal  bowel sounds.  No appreciable masses  Extremities: warm, well perfused, cap refill < 2 sec.   Musculoskeletal: Normal muscle mass.  Normal strength Skin: warm, dry.  No rash or lesions. Neurologic: alert and oriented, normal speech, no tremor    Labs: Last hemoglobin A1c: 9.4% on 07/2022 Lab Results  Component Value Date   HGBA1C 7.5 (A) 02/02/2023     Lab  Results  Component Value Date   HGBA1C 7.5 (A) 02/02/2023   HGBA1C 9.4 (A) 07/20/2022   HGBA1C 8.3 03/19/2022    Lab Results  Component Value Date   MICROALBUR 4.7 07/20/2022   LDLCALC 29 07/20/2022   CREATININE 1.03 07/20/2022    Assessment/Plan: [name] Mcintosh is a 23 y.o. male with type 1 diabetes on Omnipod and Dexcom CGM. He has a pattern of hyperglycemia between 12pm-4pm. His hemoglobin A1c is 8% which is higher then ADA goal of <7%. His time in target range is 52%, goal is >70%.    1. Type 1 diabetes  2. Hyperglycemia  - Reviewed insulin pump and CGM download. Discussed trends and patterns.  - Rotate pump sites to prevent scar tissue.  - bolus 15 minutes prior to eating to limit blood sugar spikes.  - Reviewed carb counting and importance of accurate carb counting.  - Discussed signs and symptoms of hypoglycemia. Always have glucose available.  - POCT glucose and hemoglobin A1c  - Reviewed growth chart.  - Discussed monitoring for pump site failures and pump failure protocol.  - Discussed transition to adult endocrinology after next visit.  - Omnipod 5 Iphone app reviewed and gave copies of settings.   4. Insulin Dose change   Basal (Max: 2.25 units/hr) 12AM 0.85   7am 1.0-->1.1                       Total: 24.65 units    Insulin to carbohydrate ratio (ICR)  12AM 12  10am 10 --> 9   6pm 9                  Max Bolus: 20  units    Correction Factor 12AM 45  10am 40   1Pm  40                    Target BG 12AM 130    7am 110    11pm 120                      Back up plan  Tresiba 22  units  Novolog ICR 1:10     ISF: 1:40 >120 (day) and 180 (night)   Follow-up:   3 months.   Medical decision-making:  LOS: 46 minutes  spent today reviewing the medical chart, counseling the patient/family, and documenting today's visit. This time does not include CGM interpretation.    When a patient is on insulin, intensive monitoring of blood glucose levels is necessary to avoid hyperglycemia and hypoglycemia. Severe hyperglycemia/hypoglycemia can lead to hospital admissions and be life threatening.   Gretchen Short, DNP, FNP-C  Pediatric Specialist  [address]  [place], [address]  Tele: [phone]

## 2023-11-09 NOTE — Patient Instructions (Addendum)
 Basal (Max: 2.25 units/hr) 12AM 0.85   7am 1.0-->1.1                       Total: 24.65 units    Insulin to carbohydrate ratio (ICR)  12AM 12  10am 10 --> 9   6pm 9                  Max Bolus: 20  units    Correction Factor 12AM 45  10am 40   1Pm  40                    Target BG 12AM 130    7am 110    11pm 120                    Tresiba 22 units  Novolog ICR 1:10     ISF: 1:40 >120 (day) and 180 (night)

## 2023-11-10 ENCOUNTER — Encounter (INDEPENDENT_AMBULATORY_CARE_PROVIDER_SITE_OTHER): Payer: Self-pay

## 2023-11-10 ENCOUNTER — Other Ambulatory Visit (INDEPENDENT_AMBULATORY_CARE_PROVIDER_SITE_OTHER): Payer: Self-pay | Admitting: Family

## 2023-11-10 DIAGNOSIS — R17 Unspecified jaundice: Secondary | ICD-10-CM

## 2023-11-10 LAB — COMPLETE METABOLIC PANEL WITHOUT GFR
AG Ratio: 1.7 (calc) (ref 1.0–2.5)
ALT: 15 U/L (ref 9–46)
AST: 16 U/L (ref 10–40)
Albumin: 4.7 g/dL (ref 3.6–5.1)
Alkaline phosphatase (APISO): 55 U/L (ref 36–130)
BUN: 9 mg/dL (ref 7–25)
CO2: 26 mmol/L (ref 20–32)
Calcium: 9.6 mg/dL (ref 8.6–10.3)
Chloride: 103 mmol/L (ref 98–110)
Creat: 0.96 mg/dL (ref 0.60–1.24)
Globulin: 2.8 g/dL (ref 1.9–3.7)
Glucose, Bld: 142 mg/dL — ABNORMAL HIGH (ref 65–99)
Potassium: 4.2 mmol/L (ref 3.5–5.3)
Sodium: 140 mmol/L (ref 135–146)
Total Bilirubin: 4.5 mg/dL — ABNORMAL HIGH (ref 0.2–1.2)
Total Protein: 7.5 g/dL (ref 6.1–8.1)

## 2023-11-10 LAB — LIPID PANEL
Cholesterol: 125 mg/dL (ref <200)
HDL: 79 mg/dL (ref >=40)
LDL Cholesterol (Calc): 33 mg/dL
Non-HDL Cholesterol (Calc): 46 mg/dL (ref <130)
Total CHOL/HDL Ratio: 1.6 (calc) (ref <5.0)
Triglycerides: 47 mg/dL (ref <150)

## 2023-11-10 LAB — MICROALBUMIN / CREATININE URINE RATIO
Creatinine, Urine: 509 mg/dL — ABNORMAL HIGH (ref 20–320)
Microalb Creat Ratio: 14 mg/g{creat} (ref <30)
Microalb, Ur: 7.2 mg/dL

## 2023-11-10 LAB — T4, FREE: Free T4: 1.3 ng/dL (ref 0.8–1.8)

## 2023-11-10 LAB — TSH: TSH: 1.56 m[IU]/L (ref 0.40–4.50)

## 2023-11-16 ENCOUNTER — Encounter (INDEPENDENT_AMBULATORY_CARE_PROVIDER_SITE_OTHER): Payer: Self-pay

## 2023-11-23 ENCOUNTER — Encounter (INDEPENDENT_AMBULATORY_CARE_PROVIDER_SITE_OTHER): Payer: Self-pay

## 2023-11-29 ENCOUNTER — Encounter (INDEPENDENT_AMBULATORY_CARE_PROVIDER_SITE_OTHER): Payer: Self-pay

## 2023-12-22 ENCOUNTER — Telehealth (INDEPENDENT_AMBULATORY_CARE_PROVIDER_SITE_OTHER): Payer: Self-pay | Admitting: Family

## 2023-12-22 ENCOUNTER — Encounter (INDEPENDENT_AMBULATORY_CARE_PROVIDER_SITE_OTHER): Payer: Self-pay

## 2023-12-22 NOTE — Telephone Encounter (Signed)
  Name of who is calling: Plumer  Caller's Relationship to Patient: self  Best contact number: [phone]  Provider they see: Windell Hasty  Reason for call: pt wanted to know who Spenser recommends he see before he reschedules his appt     PRESCRIPTION REFILL ONLY  Name of prescription:  Pharmacy:

## 2023-12-31 ENCOUNTER — Encounter (INDEPENDENT_AMBULATORY_CARE_PROVIDER_SITE_OTHER): Payer: Self-pay

## 2023-12-31 ENCOUNTER — Other Ambulatory Visit (INDEPENDENT_AMBULATORY_CARE_PROVIDER_SITE_OTHER): Payer: Self-pay | Admitting: Family

## 2023-12-31 DIAGNOSIS — E1065 Type 1 diabetes mellitus with hyperglycemia: Secondary | ICD-10-CM

## 2024-01-14 NOTE — Telephone Encounter (Signed)
Attempted to call, no answer left HIPAA approved message to return call.

## 2024-01-17 NOTE — Telephone Encounter (Signed)
Attempted to call, no answer left HIPAA approved message to return call.

## 2024-01-18 ENCOUNTER — Encounter (INDEPENDENT_AMBULATORY_CARE_PROVIDER_SITE_OTHER): Payer: Self-pay

## 2024-01-18 NOTE — Telephone Encounter (Addendum)
 Attempted to call, no answer left HIPAA approved message to return call  Letter sent

## 2024-01-19 ENCOUNTER — Encounter: Payer: Self-pay | Admitting: Family

## 2024-01-26 ENCOUNTER — Ambulatory Visit (INDEPENDENT_AMBULATORY_CARE_PROVIDER_SITE_OTHER): Admitting: Family

## 2024-02-08 ENCOUNTER — Telehealth (INDEPENDENT_AMBULATORY_CARE_PROVIDER_SITE_OTHER): Payer: Self-pay | Admitting: Family

## 2024-02-08 NOTE — Telephone Encounter (Signed)
 Who's calling (name and relationship to patient) : [name] Mcintosh; Self   Best contact number: [phone]  Provider they see: Verdon, NP   Reason for call: [name] Mcintosh called in stating [name] Mcintosh sent a referral [facility], but he was told that the referral was not acceptable, and that he would need to go to an adult. He is requesting a call back.    Call ID:      PRESCRIPTION REFILL ONLY  Name of prescription:  Pharmacy:

## 2024-02-09 NOTE — Telephone Encounter (Signed)
 Called [facility] to see what happened with the referral. He stated he was told they do not have adult endocrinology and would like to go to [facility]. I let him know he can contact hi pcp and get the referral through them. He asked for me to send him a Mychart message with [facility]'s endocrinologist.

## 2024-08-28 ENCOUNTER — Ambulatory Visit: Admitting: Endocrinology

## 2024-08-28 ENCOUNTER — Encounter: Payer: Self-pay | Admitting: Endocrinology

## 2024-08-28 ENCOUNTER — Other Ambulatory Visit: Payer: Self-pay | Admitting: Endocrinology

## 2024-08-28 ENCOUNTER — Ambulatory Visit: Payer: Self-pay | Admitting: Endocrinology

## 2024-08-28 ENCOUNTER — Other Ambulatory Visit

## 2024-08-28 VITALS — BP 122/72 | HR 81 | Resp 16 | Ht 70.0 in | Wt 216.8 lb

## 2024-08-28 DIAGNOSIS — E1065 Type 1 diabetes mellitus with hyperglycemia: Secondary | ICD-10-CM

## 2024-08-28 LAB — POCT GLYCOSYLATED HEMOGLOBIN (HGB A1C): Hemoglobin A1C: 7.5 % — AB (ref 4.0–5.6)

## 2024-08-28 MED ORDER — INSULIN ASPART 100 UNIT/ML IJ SOLN: INTRAMUSCULAR | 5 refills | Status: DC

## 2024-08-28 MED ORDER — DEXCOM G7 SENSOR MISC: 1.0000 | 3 refills | Status: AC

## 2024-08-28 MED ORDER — OMNIPOD 5 DEXG7G6 PODS GEN 5 MISC: 3 refills | Status: AC

## 2024-08-28 NOTE — Progress Notes (Addendum)
 "  Outpatient Endocrinology Note Stefani Baik, MD   Patient's Name: [name] Mcintosh    DOB: April 05, 2001    MRN: [id number]                                                    REASON OF VISIT: New consult for type 1 diabetes mellitus  REFERRING PROVIDER: Gretel Mungo, FNP   PCP: Pediatrics, [facility]  HISTORY OF PRESENT ILLNESS:   [name] Mcintosh is a 24 y.o. old male with past medical history listed below, is here for new consult for type 1 diabetes mellitus.   Pertinent Diabetes History: Patient is transitioning care from pediatric to adult endocrinology, for known type 1 diabetes mellitus.  Initial consult on August 28, 2024.  Patient was diagnosed with type 1 diabetes mellitus in November, 2020, hospitalized with diabetes ketoacidosis at initial visit, had hemoglobin A1c 11.1%.  He has suboptimal control of type 1 diabetes mellitus with hemoglobin A1c in the range of 7 to 9.4% range.  He was initially managed with multidose insulin  regimen, OmniPod 5 insulin  pump was started in December 2022.  History of DKA or diabetes related hospitalizations: yes at diagnosis.   Previous diabetes education: Yes   Family h/o diabetes mellitus: adopted.    Patient had negative insulin  antibody, GAD 65, IA 2 antibodies at the time of diagnosis.  Zinc transporter 8 antibody was not checked.  He had severe diabetic acidosis at the time of diagnosis, he is being managed as type 1 diabetes mellitus.   Latest Reference Range & Units 07/01/19 17:35  Hemoglobin A1C 4.8 - 5.6 % 11.1 (H)  Insulin  Antibodies, Human uU/mL <5.0  Glutamic Acid Decarb Ab 0.0 - 5.0 U/mL <5.0  Pancreatic Islet Cell Antibody Neg:<1:1  Negative  C-Peptide 1.1 - 4.4 ng/mL 0.4 (L)  (H): Data is abnormally high (L): Data is abnormally low  Chronic Diabetes Complications : Retinopathy: Unknown. Last ophthalmology exam was done on Due. Nephropathy: no Peripheral neuropathy: no Coronary artery disease: no Stroke:  no  Relevant comorbidities and cardiovascular risk factors: Obesity: yes Body mass index is 31.11 kg/m.  Hypertension: no Hyperlipidemia : no  Current / Home Diabetic regimen includes:  OmniPod 5 with Dexcom G6 using NovoLog  U100.  Insulin  Pump setting:  Basal MN- 0.85u/hour 7AM- 1.1   Bolus CHO Ratio (1unit:CHO) MN- 1:12 10 AM- 1:9 6PM- 1:9  Correction/Sensitivity: MN- 1:45 10AM- 1:40 10PM- 1:40  Target: 110-130   Active insulin  time: 3 hours  Prior diabetic medications: Multidose insulin  regimen.  CONTINUOUS GLUCOSE MONITORING SYSTEM (CGMS) / INSULIN  PUMP INTERPRETATION:                         OmniPod 5 Pump & Dexcom G6 Sensor Download (Reviewed and summarized below.) Pump: Dexcom G6 and Tandem t:slim Dates: January 6 - August 28, 2024, 14 days   Glucose Management Indicator: 7.7%  CGM/Sensor usage:86%  Automated mode 100%   Average total daily insulin :  73 units, Basal: 61%, Bolus: 39%    Glycemic Trends:          Trends:  Frequent hyperglycemia with blood sugar 200-350 range especially late evening, overnight related to high carb meal, random hyperglycemia during the day as well, most of the hyperglycemia related to missing meal bolus, not enough meal bolus  and late meal bolus.  Rare hypoglycemia related to correction for hyperglycemia.  No concerning hypoglycemia.  Some of the time acceptable blood sugar.  Glucose data is scanned into media.  Hypoglycemia: Patient has minor hypoglycemic episodes. Patient has hypoglycemia awareness, has a glucagon  ER kit and diabetic alert device.     Factors modifying glucose control: 1.  Diabetic diet assessment: 1-2 meals a day.  Evening is the largest meal.  2.  Staying active or exercising:   3.  Medication compliance: compliant most of the time.  Interval history  Initial visit today.  Hemoglobin A1c 7.5%.  Frequent postprandial hyperglycemia.  Insulin  pump and CGM data as reviewed and noted  above.   REVIEW OF SYSTEMS As per history of present illness.   PAST MEDICAL HISTORY: Past Medical History:  Diagnosis Date   Diabetes mellitus without complication (HCC)     PAST SURGICAL HISTORY: Past Surgical History:  Procedure Laterality Date   EAR TUBE REMOVAL     wisdom teeth extraction  02/25/2022    ALLERGIES: Allergies[1]  FAMILY HISTORY:  History reviewed. No pertinent family history.  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      Sophomore at [employer] with plans to major in Psychology.  23-24 school year   He lives on a off-campus apartment.    Sports- golf and football   Training   Hanging out with friends.   Social Drivers of Health   Tobacco Use: Low Risk (08/28/2024)   Patient History    Smoking Tobacco Use: Never    Smokeless Tobacco Use: Never    Passive Exposure: Not on file  Financial Resource Strain: Not on file  Food Insecurity: Not on file  Transportation Needs: Not on file  Physical Activity: Not on file  Stress: Not on file  Social Connections: Not on file  Depression (EYV7-0): Not on file  Alcohol  Screen: Not on file  Housing: Not on file  Utilities: Not on file  Health Literacy: Not on file    MEDICATIONS:  Current Outpatient Medications  Medication Sig Dispense Refill   ACCU-CHEK GUIDE test strip USE TO CHECK BLOOD GLUCOSE 6 TIMES DAILY 200 strip 5   Alcohol  Swabs (ALCOHOL  PADS) 70 % PADS Use to wipe skin  prior to insulin  injection 7 times daily 200 each 6   BAQSIMI  TWO PACK 3 MG/DOSE POWD PLACE 1 APPLICATION INTO THE NOSE AS NEEDED. USE AS DIRECTED IF UNCONSCIOUS, UNABLE TO TAKE FOOD, OR HAVING A SEIZURE DUE TO HYPOGLYCEMIA 2 each 1   Blood Glucose Monitoring Suppl (ACCU-CHEK GUIDE) w/Device KIT 1 kit by Does not apply route 6 (six) times daily. 1 kit 1   Continuous Glucose Sensor (DEXCOM G6 SENSOR) MISC Change every 10 days 3 each 11   Continuous Glucose Sensor (DEXCOM G7 SENSOR) MISC 1 Device by Does not apply route continuous. Change  every 10 days. 9 each 3   Continuous Glucose Transmitter (DEXCOM G6 TRANSMITTER) MISC CHANGE EVERY 90 DAYS. 1 each 2   insulin  aspart (NOVOLOG  FLEXPEN) 100 UNIT/ML FlexPen INJECT UP TO 70 UNITS PER DAY AS PRESCRIBED 45 mL 1   Insulin  Pen Needle (BD PEN NEEDLE NANO 2ND GEN) 32G X 4 MM MISC Use to inject insulin  8 times a day 300 each 5   Lancets Misc. (ACCU-CHEK FASTCLIX LANCET) KIT Use to check blood sugar up to 10 times daily 1 kit 3   insulin  aspart (NOVOLOG ) 100 UNIT/ML injection INJECT UP TO 200 UNITS INTO INSULIN  PUMP EVERY 2 DAYS 60 mL 5   Insulin  Disposable Pump (OMNIPOD 5 DEXG7G6 PODS GEN 5) MISC Change pod every 2 days. Patient will need 3 boxes (each contain 5 pods) for a 30 day supply. Please fill for [facility] [address]-21. 30 each 3   insulin  glargine (LANTUS  SOLOSTAR) 100 UNIT/ML Solostar Pen 3 month supply. Inject up to 50 units per day (Patient not taking: Reported on 08/28/2024) 45 mL 2   No current facility-administered medications for this visit.    PHYSICAL EXAM: Vitals:   08/28/24 0951  BP: 122/72  Pulse: 81  Resp: 16  SpO2: 97%  Weight: 216 lb 12.8 oz (98.3 kg)  Height: 5' 10 (1.778 m)   Body mass index is 31.11 kg/m.  Wt Readings from Last 3 Encounters:  08/28/24 216 lb 12.8 oz (98.3 kg)  11/09/23 205 lb 12.8 oz (93.4 kg)  02/02/23 211 lb 6.4 oz (95.9 kg)    General: Well developed, well nourished male in no apparent distress.   HEENT: AT/[place], no external lesions.  Eyes: Conjunctiva clear and no icterus. Neck: Neck supple  Lungs: Respirations not labored Neurologic: Alert, oriented, normal speech Extremities / Skin: Dry. No sores or rashes noted. No acanthosis nigricans Psychiatric: Does not appear depressed or anxious  Diabetic Foot Exam - Simple   Simple Foot Form Diabetic Foot exam was performed with the following findings: Yes 08/28/2024 10:33 AM  Visual Inspection No deformities, no ulcerations, no other skin breakdown bilaterally: Yes Sensation Testing Intact to touch and monofilament testing bilaterally: Yes Pulse Check Posterior Tibialis and Dorsalis pulse intact bilaterally: Yes Comments    LABS Reviewed Lab Results  Component Value Date   HGBA1C 7.5 (A) 08/28/2024   HGBA1C 8.0 (A) 11/09/2023   HGBA1C 7.5 (A) 02/02/2023   No results found for: FRUCTOSAMINE Lab Results  Component Value Date   CHOL 125 11/09/2023   HDL 79 11/09/2023   LDLCALC 33 11/09/2023   TRIG 47 11/09/2023   CHOLHDL 1.6 11/09/2023   Lab Results  Component Value Date   MICRALBCREAT 14 11/09/2023   MICRALBCREAT 9 07/20/2022   Lab Results  Component Value Date   CREATININE 0.96 11/09/2023   No results found for: GFR  ASSESSMENT / PLAN  1. Uncontrolled type 1 diabetes mellitus with hyperglycemia (HCC)     Diabetes Mellitus type 1, complicated by no known other complications. - Diabetic status / severity: Uncontrolled.  Lab Results  Component Value Date   HGBA1C 7.5 (A) 08/28/2024    - Hemoglobin A1c goal <6.5%   Discussed about type 1 diabetes mellitus, goal of control, potential chronic diabetic complications including diabetic retinopathy, neuropathy and nephropathy.  Frequent postprandial hyperglycemia.  Changed pump setting as follows.  He is not getting enough meal boluses, changed to bolus setting as follows.  Advised for meal bolus preferably 10 to 15 minutes before eating.  -  Medications:   Insulin  pump setting changed as follows:  OmniPod 5 with Dexcom G6 using NovoLog  U100.  Will change to Dexcom G7 for CGM.  Insulin  Pump setting:  Basal MN- 0.85u/hour, changed to 1.1 7AM- 1.1 , changed to 1.2  Bolus CHO Ratio (1unit:CHO) MN- 1:12, changed to 1 : 8 10 AM- 1:9, changed to 1:8 6PM- 1:9, changed to 1:8  Correction/Sensitivity: MN- 1:45, changed to 1 :30 10AM- 1:40, changed to 1:30 10PM- 1:40, changed to 1 : 30  Target: 110-130   Active insulin  time: 3 hours  - Home glucose testing: continue CGM and check blood glucose as needed.  Sent prescription for Dexcom G7.  He is currently using Dexcom G6.  - Discussed/ Gave Hypoglycemia treatment plan.  # Consult : not required at this time.   # Annual urine for microalbuminuria/ creatinine ratio, no microalbuminuria currently, will check today.  Along with BMP. Last  Lab Results  Component Value Date   MICRALBCREAT 14 11/09/2023    # Foot check nightly.  # Annual dilated diabetic eye exams.  Send referral to ophthalmology for diabetic eye exam.  He never had diabetic eye exam in the past.  - Diet: Make healthy diabetic food choices.  Advised to avoid high carb meals.  Portion control. - Life style / activity / exercise: Discussed.  2. Blood pressure  -  BP Readings from Last 1 Encounters:  08/28/24 122/72    - Control is in target.  - No change in current plans.  3. Lipid status / Hyperlipidemia - Last  Lab Results  Component Value Date   LDLCALC 33 11/09/2023   - No indication of statin at this time.  Diagnoses and all orders for this visit:  Uncontrolled type 1 diabetes mellitus with hyperglycemia (HCC) -     Ambulatory referral to Ophthalmology -     POCT glycosylated hemoglobin (Hb A1C) -     Microalbumin / creatinine urine ratio -     Basic metabolic panel with GFR -     Insulin  Disposable Pump (OMNIPOD 5 DEXG7G6 PODS GEN 5) MISC; Change pod every 2 days. Patient will need 3 boxes (each  contain 5 pods) for a 30 day supply. Please fill for [facility] [address]-21. -     Continuous Glucose Sensor (DEXCOM G7 SENSOR) MISC; 1 Device by Does not apply route continuous. Change every 10 days. -     insulin  aspart (NOVOLOG ) 100 UNIT/ML injection; INJECT UP TO 200 UNITS INTO INSULIN  PUMP EVERY 2 DAYS    DISPOSITION Follow up in clinic in 3-4  months suggested.  Labs today as ordered.   All questions answered and patient verbalized understanding of the plan.  Synda Bagent, MD [facility] Endocrinology [facility] [address] [place], [address] [place], [place] [address] Phone # [phone]  At least part of this note was generated using voice recognition software. Inadvertent word errors may have occurred, which were not recognized during the proofreading process.     [1] No Known Allergies  "

## 2024-08-31 LAB — MICROALBUMIN / CREATININE URINE RATIO
Creatinine, Urine: 382 mg/dL — ABNORMAL HIGH (ref 20–320)
Microalb Creat Ratio: 2 mg/g{creat}
Microalb, Ur: 0.9 mg/dL

## 2024-08-31 LAB — BASIC METABOLIC PANEL WITH GFR
BUN: 17 mg/dL (ref 7–25)
CO2: 24 mmol/L (ref 20–32)
Calcium: 10 mg/dL (ref 8.6–10.3)
Chloride: 108 mmol/L (ref 98–110)
Creat: 1.07 mg/dL (ref 0.60–1.24)
Glucose, Bld: 111 mg/dL — ABNORMAL HIGH (ref 65–99)
Potassium: 4.4 mmol/L (ref 3.5–5.3)
Sodium: 147 mmol/L — ABNORMAL HIGH (ref 135–146)
eGFR: 101 mL/min/1.73m2

## 2024-12-06 ENCOUNTER — Ambulatory Visit: Admitting: Endocrinology
